# Patient Record
Sex: Male | Born: 1990 | Race: White | Hispanic: No | Marital: Married | Smoking: Never smoker
Health system: Southern US, Community
[De-identification: ages and names within clinical notes are randomized; demographics above are authoritative.]

## PROBLEM LIST (undated history)

## (undated) HISTORY — PX: ELBOW SURGERY: SHX618

## (undated) HISTORY — PX: HIP SURGERY: SHX245

---

## 2013-12-21 ENCOUNTER — Other Ambulatory Visit: Payer: Self-pay | Admitting: Surgery

## 2013-12-21 DIAGNOSIS — M5412 Radiculopathy, cervical region: Secondary | ICD-10-CM

## 2013-12-22 ENCOUNTER — Ambulatory Visit
Admission: RE | Admit: 2013-12-22 | Discharge: 2013-12-22 | Disposition: A | Discharge status: Home / Self Care | Payer: BC Managed Care – PPO | Source: Ambulatory Visit | Attending: Orthopedic Surgery | Admitting: Orthopedic Surgery

## 2013-12-22 DIAGNOSIS — M5412 Radiculopathy, cervical region: Secondary | ICD-10-CM

## 2016-06-30 ENCOUNTER — Ambulatory Visit (INDEPENDENT_AMBULATORY_CARE_PROVIDER_SITE_OTHER): Payer: BLUE CROSS/BLUE SHIELD | Admitting: Physician Assistant

## 2016-06-30 ENCOUNTER — Ambulatory Visit (INDEPENDENT_AMBULATORY_CARE_PROVIDER_SITE_OTHER): Payer: BLUE CROSS/BLUE SHIELD

## 2016-06-30 VITALS — BP 126/76 | HR 64 | Temp 97.8°F | Resp 20 | Ht 71.0 in | Wt 145.0 lb

## 2016-06-30 DIAGNOSIS — S0300XA Dislocation of jaw, unspecified side, initial encounter: Secondary | ICD-10-CM

## 2016-06-30 DIAGNOSIS — R6884 Jaw pain: Secondary | ICD-10-CM

## 2016-06-30 DIAGNOSIS — R252 Cramp and spasm: Secondary | ICD-10-CM | POA: Diagnosis not present

## 2016-06-30 DIAGNOSIS — Z23 Encounter for immunization: Secondary | ICD-10-CM

## 2016-06-30 LAB — POCT CBC
GRANULOCYTE PERCENT: 91.9 % — AB (ref 37–80)
HCT, POC: 47 % (ref 43.5–53.7)
Hemoglobin: 16.8 g/dL (ref 14.1–18.1)
LYMPH, POC: 1 (ref 0.6–3.4)
MCH, POC: 34 pg — AB (ref 27–31.2)
MCHC: 35.7 g/dL — AB (ref 31.8–35.4)
MCV: 95.2 fL (ref 80–97)
MID (cbc): 0.7 (ref 0–0.9)
MPV: 7.7 fL (ref 0–99.8)
POC Granulocyte: 18.7 — AB (ref 2–6.9)
POC LYMPH PERCENT: 4.9 %L — AB (ref 10–50)
POC MID %: 3.2 %M (ref 0–12)
Platelet Count, POC: 292 10*3/uL (ref 142–424)
RBC: 4.93 M/uL (ref 4.69–6.13)
RDW, POC: 12 %
WBC: 20.4 10*3/uL — AB (ref 4.6–10.2)

## 2016-06-30 LAB — POCT URINALYSIS DIP (MANUAL ENTRY)
Bilirubin, UA: NEGATIVE
Glucose, UA: NEGATIVE
Leukocytes, UA: NEGATIVE
Nitrite, UA: NEGATIVE
PH UA: 8.5
RBC UA: NEGATIVE
SPEC GRAV UA: 1.015
Urobilinogen, UA: 0.2

## 2016-06-30 LAB — CMP14+EGFR
ALT: 26 IU/L (ref 0–44)
AST: 23 IU/L (ref 0–40)
Albumin/Globulin Ratio: 2 (ref 1.2–2.2)
Albumin: 5.1 g/dL (ref 3.5–5.5)
Alkaline Phosphatase: 75 IU/L (ref 39–117)
BILIRUBIN TOTAL: 1 mg/dL (ref 0.0–1.2)
BUN/Creatinine Ratio: 18 (ref 9–20)
BUN: 17 mg/dL (ref 6–20)
CHLORIDE: 95 mmol/L — AB (ref 96–106)
CO2: 15 mmol/L — CL (ref 18–29)
Calcium: 10.1 mg/dL (ref 8.7–10.2)
Creatinine, Ser: 0.93 mg/dL (ref 0.76–1.27)
GFR calc Af Amer: 132 mL/min/{1.73_m2} (ref >59)
GFR calc non Af Amer: 114 mL/min/{1.73_m2} (ref >59)
GLOBULIN, TOTAL: 2.5 g/dL (ref 1.5–4.5)
Glucose: 125 mg/dL — ABNORMAL HIGH (ref 65–99)
Potassium: 4.2 mmol/L (ref 3.5–5.2)
SODIUM: 141 mmol/L (ref 134–144)
Total Protein: 7.6 g/dL (ref 6.0–8.5)

## 2016-06-30 MED ORDER — KETOROLAC TROMETHAMINE 30 MG/ML IJ SOLN
30.0000 mg | Freq: Once | INTRAMUSCULAR | Status: AC
  Administered 2016-06-30: 30 mg via INTRAMUSCULAR

## 2016-06-30 MED ORDER — DIPHENHYDRAMINE HCL 50 MG/ML IJ SOLN
50.0000 mg | Freq: Once | INTRAMUSCULAR | Status: AC
  Administered 2016-06-30: 50 mg via INTRAMUSCULAR

## 2016-06-30 MED ORDER — DIPHENHYDRAMINE HCL 50 MG/ML IJ SOLN
25.0000 mg | Freq: Once | INTRAMUSCULAR | Status: AC
  Administered 2016-06-30: 25 mg via INTRAMUSCULAR

## 2016-06-30 NOTE — Progress Notes (Signed)
06/30/2016 11:30 AM   DOB: [birth year] / MRN: [id number]  SUBJECTIVE:  Vincent Duran is a 26 y.o. male presenting for "my jaw has been locked up for the last 4 hours." 1 He has No Known Allergies.   He  has no past medical history on file.    He  reports that he has never smoked. He does not have any smokeless tobacco history on file. He  has no sexual activity history on file. The patient  has no past surgical history on file.  His family history is not on file.  Review of Systems  Constitutional: Negative for chills and fever.  Cardiovascular: Negative for chest pain.  Skin: Negative for itching and rash.  Neurological: Negative for dizziness.    The problem list and medications were reviewed and updated by myself where necessary and exist elsewhere in the encounter.   OBJECTIVE:  BP 126/76 (BP Location: Left Arm, Patient Position: Prone, Cuff Size: Normal)   Pulse 64   Temp 97.8 F (36.6 C) (Axillary)   Resp 20   Ht 5' 11"  (1.803 m)   Wt 145 lb (65.8 kg)   SpO2 100%   BMI 20.22 kg/m   Physical Exam  Constitutional: He is oriented to person, place, and time. He appears well-developed and well-nourished.  HENT:  Head:    Cardiovascular: Normal rate and regular rhythm.   Pulmonary/Chest: Effort normal and breath sounds normal.  Musculoskeletal: Normal range of motion.  Neurological: He is alert and oriented to person, place, and time.  Skin: Skin is warm and dry.    Results for orders placed or performed in visit on 06/30/16 (from the past 72 hour(s))  POCT CBC     Status: Abnormal   Collection Time: 06/30/16  8:56 AM  Result Value Ref Range   WBC 20.4 (A) 4.6 - 10.2 K/uL   Lymph, poc 1.0 0.6 - 3.4   POC LYMPH PERCENT 4.9 (A) 10 - 50 %L   MID (cbc) 0.7 0 - 0.9   POC MID % 3.2 0 - 12 %M   POC Granulocyte 18.7 (A) 2 - 6.9   Granulocyte percent 91.9 (A) 37 - 80 %G   RBC 4.93 4.69 - 6.13 M/uL   Hemoglobin 16.8 14.1 - 18.1 g/dL   HCT, POC 47.0 43.5 - 53.7 %   MCV  95.2 80 - 97 fL   MCH, POC 34.0 (A) 27 - 31.2 pg   MCHC 35.7 (A) 31.8 - 35.4 g/dL   RDW, POC 12.0 %   Platelet Count, POC 292 142 - 424 K/uL   MPV 7.7 0 - 99.8 fL  POCT urinalysis dipstick     Status: Abnormal   Collection Time: 06/30/16  9:37 AM  Result Value Ref Range   Color, UA yellow yellow   Clarity, UA clear clear   Glucose, UA negative negative   Bilirubin, UA negative negative   Ketones, POC UA >= (160) (A) negative   Spec Grav, UA 1.015    Blood, UA negative negative   pH, UA 8.5    Protein Ur, POC =30 (A) negative   Urobilinogen, UA 0.2    Nitrite, UA Negative Negative   Leukocytes, UA Negative Negative    Dg Mandible 4 Views  Result Date: 06/30/2016 CLINICAL DATA:  Mandibular cramping and locking for several hours. No known injury. The patient is unable to close is mouth. EXAM: MANDIBLE - 4+ VIEW COMPARISON:  None. FINDINGS: No fracture is identified. The mandibular  condyles demonstrate anterior translation over the condylar eminences as the patient's mouth is opened. Anterior translation is greater than typically seen with mouth opening on the left. Imaged bones and joints otherwise appear normal. IMPRESSION: Anterior translation of the mandibular condyles over the condylar eminence appears greater than typically seen with mouth opening on the left. Negative for fracture. Electronically Signed   By: Inge Rise M.D.   On: 06/30/2016 09:39    ASSESSMENT AND PLAN:  Brydon was seen today for body cramping and jaw locked.  Diagnoses and all orders for this visit:  Cramping of hands -     ketorolac (TORADOL) 30 MG/ML injection 30 mg; Inject 1 mL (30 mg total) into the muscle once. -     POCT CBC -     POCT urinalysis dipstick -     CMP14+EGFR -     diphenhydrAMINE (BENADRYL) injection 25 mg; Inject 0.5 mLs (25 mg total) into the muscle once.  Jaw pain -     diphenhydrAMINE (BENADRYL) injection 50 mg; Inject 1 mL (50 mg total) into the muscle once. -     DG  Mandible 4 Views; Future  Need for prophylactic vaccination with tetanus-diphtheria (TD) -     Td vaccine greater than or equal to 7yo preservative free IM  Dislocation of temporomandibular joint, initial encounter: Secondary to emesis.  He is well appearing and vitals have been stable throughout.  He has received 1.5 liters normal saline. Comments: Reduced in the clinic. Patient tolerated procedure without complaint.  Advised NSAIDS and soft foods for the next 48 hours.      The patient is advised to call or return to clinic if he does not see an improvement in symptoms, or to seek the care of the closest emergency department if he worsens with the above plan.   Philis Fendt, MHS, PA-C [facility] 06/30/2016 11:30 AM

## 2016-06-30 NOTE — Patient Instructions (Addendum)
Take 3 800 Motrin every 6-8hrs as needed.  IF you received an x-ray today, you will receive an invoice from [facility]. Please contact [facility] at [phone] with questions or concerns regarding your invoice.   IF you received labwork today, you will receive an invoice from [facility]. Please contact [facility] at [phone] with questions or concerns regarding your invoice.   Our billing staff will not be able to assist you with questions regarding bills from these companies.  You will be contacted with the lab results as soon as they are available. The fastest way to get your results is to activate your My Chart account. Instructions are located on the last page of this paperwork. If you have not heard from us regarding the results in 2 weeks, please contact this office.

## 2016-09-24 DIAGNOSIS — L249 Irritant contact dermatitis, unspecified cause: Secondary | ICD-10-CM | POA: Diagnosis not present

## 2016-11-05 DIAGNOSIS — M79672 Pain in left foot: Secondary | ICD-10-CM | POA: Diagnosis not present

## 2016-11-05 DIAGNOSIS — M722 Plantar fascial fibromatosis: Secondary | ICD-10-CM | POA: Diagnosis not present

## 2016-11-15 DIAGNOSIS — M722 Plantar fascial fibromatosis: Secondary | ICD-10-CM | POA: Diagnosis not present

## 2016-11-15 DIAGNOSIS — M79672 Pain in left foot: Secondary | ICD-10-CM | POA: Diagnosis not present

## 2017-04-01 ENCOUNTER — Ambulatory Visit (INDEPENDENT_AMBULATORY_CARE_PROVIDER_SITE_OTHER): Payer: BLUE CROSS/BLUE SHIELD | Admitting: Sports Medicine

## 2017-04-01 DIAGNOSIS — M216X1 Other acquired deformities of right foot: Secondary | ICD-10-CM

## 2017-04-01 DIAGNOSIS — M216X2 Other acquired deformities of left foot: Secondary | ICD-10-CM

## 2017-04-01 NOTE — Progress Notes (Signed)
   Subjective:    Patient ID: Vincent Duran, male    DOB: Jul 21, 1990, 26 y.o.   MRN: [id number]  HPI chief complaint: Left foot pain  Very pleasant 26 year old male comes in today complaining of 2 months of left foot pain. He denies any injury but rather describes a gradual onset of pain that he localizes primarily along the arch of his foot. He has been diagnosed with plantar fasciitis in the past. In fact, he has had 2 previous bouts of plantar fasciitis. This was treated with physical therapy as well as night splinting. He was also treated with a brief period of immobilization in a boot. When his pain returned several weeks ago he resumed wearing his Cam Walker and as a result his symptoms are improving. He has tried changing running shoes but unfortunately has not been able to return to running due to his pain. 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 No numbness or tingling. No recent imaging.  Past medical history reviewed Medications reviewed Allergies reviewed     Review of Systems    As above  Objective:   Physical Exam  Well-developed, fit appearing. No acute distress. Awake alert and oriented 3. Vital signs reviewed  Examination of the left foot shows a rigid cavus foot. There is no tenderness to palpation at the calcaneal origin of the plantar fascia. No significant tenderness along the arch of the foot. No soft tissue swelling. Negative calcaneal squeeze. Neurovascularly intact distally. Evaluation of his running gait shows him to be a midfoot Striker. Runs without a limp.  MSK ultrasound of the left heel was performed. Limited images were obtained. His plantar fascia is within normal limits. There is a small hypoechoic area which I believe is some slight edema but I do not see a tear. Calcaneus is well visualized without any obvious bone spur.      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 I think he likely has a chronic arch strain. He definitely needs arch support. He has an off-the-shelf arch support which is uncomfortable. We will try green sports insoles with a scaphoid pad and he will return to the office in 4 weeks for reevaluation. I think he would benefit greatly from a custom orthotic at some point in time and if he finds the scaphoid pads to be comfortable then we will consider constructing those at his follow-up visit.

## 2017-04-15 ENCOUNTER — Encounter: Payer: BLUE CROSS/BLUE SHIELD | Admitting: Sports Medicine

## 2017-04-29 DIAGNOSIS — M542 Cervicalgia: Secondary | ICD-10-CM | POA: Diagnosis not present

## 2017-04-29 DIAGNOSIS — M9902 Segmental and somatic dysfunction of thoracic region: Secondary | ICD-10-CM | POA: Diagnosis not present

## 2017-04-29 DIAGNOSIS — M9901 Segmental and somatic dysfunction of cervical region: Secondary | ICD-10-CM | POA: Diagnosis not present

## 2017-04-29 DIAGNOSIS — M546 Pain in thoracic spine: Secondary | ICD-10-CM | POA: Diagnosis not present

## 2017-04-30 DIAGNOSIS — M9901 Segmental and somatic dysfunction of cervical region: Secondary | ICD-10-CM | POA: Diagnosis not present

## 2017-04-30 DIAGNOSIS — M546 Pain in thoracic spine: Secondary | ICD-10-CM | POA: Diagnosis not present

## 2017-04-30 DIAGNOSIS — M542 Cervicalgia: Secondary | ICD-10-CM | POA: Diagnosis not present

## 2017-04-30 DIAGNOSIS — M9902 Segmental and somatic dysfunction of thoracic region: Secondary | ICD-10-CM | POA: Diagnosis not present

## 2017-05-03 DIAGNOSIS — M9902 Segmental and somatic dysfunction of thoracic region: Secondary | ICD-10-CM | POA: Diagnosis not present

## 2017-05-03 DIAGNOSIS — M542 Cervicalgia: Secondary | ICD-10-CM | POA: Diagnosis not present

## 2017-05-03 DIAGNOSIS — M546 Pain in thoracic spine: Secondary | ICD-10-CM | POA: Diagnosis not present

## 2017-05-03 DIAGNOSIS — M9901 Segmental and somatic dysfunction of cervical region: Secondary | ICD-10-CM | POA: Diagnosis not present

## 2017-05-06 DIAGNOSIS — M9901 Segmental and somatic dysfunction of cervical region: Secondary | ICD-10-CM | POA: Diagnosis not present

## 2017-05-06 DIAGNOSIS — M542 Cervicalgia: Secondary | ICD-10-CM | POA: Diagnosis not present

## 2017-05-06 DIAGNOSIS — M546 Pain in thoracic spine: Secondary | ICD-10-CM | POA: Diagnosis not present

## 2017-05-06 DIAGNOSIS — M9902 Segmental and somatic dysfunction of thoracic region: Secondary | ICD-10-CM | POA: Diagnosis not present

## 2017-05-07 ENCOUNTER — Ambulatory Visit: Payer: BLUE CROSS/BLUE SHIELD | Admitting: Sports Medicine

## 2017-05-10 DIAGNOSIS — M9902 Segmental and somatic dysfunction of thoracic region: Secondary | ICD-10-CM | POA: Diagnosis not present

## 2017-05-10 DIAGNOSIS — M542 Cervicalgia: Secondary | ICD-10-CM | POA: Diagnosis not present

## 2017-05-10 DIAGNOSIS — M9901 Segmental and somatic dysfunction of cervical region: Secondary | ICD-10-CM | POA: Diagnosis not present

## 2017-05-10 DIAGNOSIS — M546 Pain in thoracic spine: Secondary | ICD-10-CM | POA: Diagnosis not present

## 2017-05-14 DIAGNOSIS — M9901 Segmental and somatic dysfunction of cervical region: Secondary | ICD-10-CM | POA: Diagnosis not present

## 2017-05-14 DIAGNOSIS — M546 Pain in thoracic spine: Secondary | ICD-10-CM | POA: Diagnosis not present

## 2017-05-14 DIAGNOSIS — M9902 Segmental and somatic dysfunction of thoracic region: Secondary | ICD-10-CM | POA: Diagnosis not present

## 2017-05-14 DIAGNOSIS — M542 Cervicalgia: Secondary | ICD-10-CM | POA: Diagnosis not present

## 2017-05-20 DIAGNOSIS — M9902 Segmental and somatic dysfunction of thoracic region: Secondary | ICD-10-CM | POA: Diagnosis not present

## 2017-05-20 DIAGNOSIS — M546 Pain in thoracic spine: Secondary | ICD-10-CM | POA: Diagnosis not present

## 2017-05-20 DIAGNOSIS — M9901 Segmental and somatic dysfunction of cervical region: Secondary | ICD-10-CM | POA: Diagnosis not present

## 2017-05-20 DIAGNOSIS — M542 Cervicalgia: Secondary | ICD-10-CM | POA: Diagnosis not present

## 2017-05-24 DIAGNOSIS — M542 Cervicalgia: Secondary | ICD-10-CM | POA: Diagnosis not present

## 2017-05-24 DIAGNOSIS — M546 Pain in thoracic spine: Secondary | ICD-10-CM | POA: Diagnosis not present

## 2017-05-24 DIAGNOSIS — M9901 Segmental and somatic dysfunction of cervical region: Secondary | ICD-10-CM | POA: Diagnosis not present

## 2017-05-24 DIAGNOSIS — M9902 Segmental and somatic dysfunction of thoracic region: Secondary | ICD-10-CM | POA: Diagnosis not present

## 2017-05-29 DIAGNOSIS — M546 Pain in thoracic spine: Secondary | ICD-10-CM | POA: Diagnosis not present

## 2017-05-29 DIAGNOSIS — M542 Cervicalgia: Secondary | ICD-10-CM | POA: Diagnosis not present

## 2017-05-29 DIAGNOSIS — M9902 Segmental and somatic dysfunction of thoracic region: Secondary | ICD-10-CM | POA: Diagnosis not present

## 2017-05-29 DIAGNOSIS — M9901 Segmental and somatic dysfunction of cervical region: Secondary | ICD-10-CM | POA: Diagnosis not present

## 2017-06-05 DIAGNOSIS — M9901 Segmental and somatic dysfunction of cervical region: Secondary | ICD-10-CM | POA: Diagnosis not present

## 2017-06-05 DIAGNOSIS — M546 Pain in thoracic spine: Secondary | ICD-10-CM | POA: Diagnosis not present

## 2017-06-05 DIAGNOSIS — M542 Cervicalgia: Secondary | ICD-10-CM | POA: Diagnosis not present

## 2017-06-05 DIAGNOSIS — M9902 Segmental and somatic dysfunction of thoracic region: Secondary | ICD-10-CM | POA: Diagnosis not present

## 2017-06-10 DIAGNOSIS — M546 Pain in thoracic spine: Secondary | ICD-10-CM | POA: Diagnosis not present

## 2017-06-10 DIAGNOSIS — M9902 Segmental and somatic dysfunction of thoracic region: Secondary | ICD-10-CM | POA: Diagnosis not present

## 2017-06-10 DIAGNOSIS — M542 Cervicalgia: Secondary | ICD-10-CM | POA: Diagnosis not present

## 2017-06-10 DIAGNOSIS — M9901 Segmental and somatic dysfunction of cervical region: Secondary | ICD-10-CM | POA: Diagnosis not present

## 2017-06-20 DIAGNOSIS — M722 Plantar fascial fibromatosis: Secondary | ICD-10-CM | POA: Diagnosis not present

## 2017-06-26 DIAGNOSIS — M542 Cervicalgia: Secondary | ICD-10-CM | POA: Diagnosis not present

## 2017-06-26 DIAGNOSIS — M9902 Segmental and somatic dysfunction of thoracic region: Secondary | ICD-10-CM | POA: Diagnosis not present

## 2017-06-26 DIAGNOSIS — M546 Pain in thoracic spine: Secondary | ICD-10-CM | POA: Diagnosis not present

## 2017-06-26 DIAGNOSIS — M9901 Segmental and somatic dysfunction of cervical region: Secondary | ICD-10-CM | POA: Diagnosis not present

## 2017-07-04 DIAGNOSIS — M9901 Segmental and somatic dysfunction of cervical region: Secondary | ICD-10-CM | POA: Diagnosis not present

## 2017-07-04 DIAGNOSIS — M542 Cervicalgia: Secondary | ICD-10-CM | POA: Diagnosis not present

## 2017-07-04 DIAGNOSIS — M546 Pain in thoracic spine: Secondary | ICD-10-CM | POA: Diagnosis not present

## 2017-07-04 DIAGNOSIS — M9902 Segmental and somatic dysfunction of thoracic region: Secondary | ICD-10-CM | POA: Diagnosis not present

## 2017-07-12 DIAGNOSIS — M722 Plantar fascial fibromatosis: Secondary | ICD-10-CM | POA: Diagnosis not present

## 2017-07-24 DIAGNOSIS — M546 Pain in thoracic spine: Secondary | ICD-10-CM | POA: Diagnosis not present

## 2017-07-24 DIAGNOSIS — M9902 Segmental and somatic dysfunction of thoracic region: Secondary | ICD-10-CM | POA: Diagnosis not present

## 2017-07-24 DIAGNOSIS — M9901 Segmental and somatic dysfunction of cervical region: Secondary | ICD-10-CM | POA: Diagnosis not present

## 2017-07-24 DIAGNOSIS — M542 Cervicalgia: Secondary | ICD-10-CM | POA: Diagnosis not present

## 2017-08-05 DIAGNOSIS — M9901 Segmental and somatic dysfunction of cervical region: Secondary | ICD-10-CM | POA: Diagnosis not present

## 2017-08-05 DIAGNOSIS — M542 Cervicalgia: Secondary | ICD-10-CM | POA: Diagnosis not present

## 2017-08-05 DIAGNOSIS — M9902 Segmental and somatic dysfunction of thoracic region: Secondary | ICD-10-CM | POA: Diagnosis not present

## 2017-08-05 DIAGNOSIS — M546 Pain in thoracic spine: Secondary | ICD-10-CM | POA: Diagnosis not present

## 2017-08-22 DIAGNOSIS — M542 Cervicalgia: Secondary | ICD-10-CM | POA: Diagnosis not present

## 2017-08-22 DIAGNOSIS — M9902 Segmental and somatic dysfunction of thoracic region: Secondary | ICD-10-CM | POA: Diagnosis not present

## 2017-08-22 DIAGNOSIS — M546 Pain in thoracic spine: Secondary | ICD-10-CM | POA: Diagnosis not present

## 2017-08-22 DIAGNOSIS — M9901 Segmental and somatic dysfunction of cervical region: Secondary | ICD-10-CM | POA: Diagnosis not present

## 2017-09-17 DIAGNOSIS — Z7689 Persons encountering health services in other specified circumstances: Secondary | ICD-10-CM | POA: Diagnosis not present

## 2017-09-17 DIAGNOSIS — R5383 Other fatigue: Secondary | ICD-10-CM | POA: Diagnosis not present

## 2017-09-18 DIAGNOSIS — M542 Cervicalgia: Secondary | ICD-10-CM | POA: Diagnosis not present

## 2017-09-18 DIAGNOSIS — M9902 Segmental and somatic dysfunction of thoracic region: Secondary | ICD-10-CM | POA: Diagnosis not present

## 2017-09-18 DIAGNOSIS — M546 Pain in thoracic spine: Secondary | ICD-10-CM | POA: Diagnosis not present

## 2017-09-18 DIAGNOSIS — M9901 Segmental and somatic dysfunction of cervical region: Secondary | ICD-10-CM | POA: Diagnosis not present

## 2017-10-01 DIAGNOSIS — M722 Plantar fascial fibromatosis: Secondary | ICD-10-CM | POA: Diagnosis not present

## 2017-10-01 DIAGNOSIS — M79672 Pain in left foot: Secondary | ICD-10-CM | POA: Diagnosis not present

## 2017-10-07 DIAGNOSIS — M722 Plantar fascial fibromatosis: Secondary | ICD-10-CM | POA: Diagnosis not present

## 2017-10-07 DIAGNOSIS — M79672 Pain in left foot: Secondary | ICD-10-CM | POA: Diagnosis not present

## 2017-10-11 DIAGNOSIS — M79672 Pain in left foot: Secondary | ICD-10-CM | POA: Diagnosis not present

## 2017-10-11 DIAGNOSIS — M722 Plantar fascial fibromatosis: Secondary | ICD-10-CM | POA: Diagnosis not present

## 2017-10-14 DIAGNOSIS — M79672 Pain in left foot: Secondary | ICD-10-CM | POA: Diagnosis not present

## 2017-10-14 DIAGNOSIS — M722 Plantar fascial fibromatosis: Secondary | ICD-10-CM | POA: Diagnosis not present

## 2017-10-21 DIAGNOSIS — M722 Plantar fascial fibromatosis: Secondary | ICD-10-CM | POA: Diagnosis not present

## 2017-10-21 DIAGNOSIS — M79672 Pain in left foot: Secondary | ICD-10-CM | POA: Diagnosis not present

## 2017-10-25 DIAGNOSIS — M79672 Pain in left foot: Secondary | ICD-10-CM | POA: Diagnosis not present

## 2017-10-25 DIAGNOSIS — M722 Plantar fascial fibromatosis: Secondary | ICD-10-CM | POA: Diagnosis not present

## 2017-10-30 DIAGNOSIS — M79672 Pain in left foot: Secondary | ICD-10-CM | POA: Diagnosis not present

## 2017-10-30 DIAGNOSIS — M722 Plantar fascial fibromatosis: Secondary | ICD-10-CM | POA: Diagnosis not present

## 2017-11-05 DIAGNOSIS — M79672 Pain in left foot: Secondary | ICD-10-CM | POA: Diagnosis not present

## 2017-11-05 DIAGNOSIS — M722 Plantar fascial fibromatosis: Secondary | ICD-10-CM | POA: Diagnosis not present

## 2017-11-12 ENCOUNTER — Encounter: Payer: Self-pay | Admitting: Family Medicine

## 2017-11-12 ENCOUNTER — Ambulatory Visit: Payer: BLUE CROSS/BLUE SHIELD | Admitting: Family Medicine

## 2017-11-12 DIAGNOSIS — M722 Plantar fascial fibromatosis: Secondary | ICD-10-CM

## 2017-11-12 MED ORDER — NITROGLYCERIN 0.2 MG/HR TD PT24: MEDICATED_PATCH | TRANSDERMAL | 1 refills | Status: DC

## 2017-11-12 NOTE — Patient Instructions (Signed)
You have plantar fasciitis Take tylenol and/or aleve as needed for pain  Nitro patches 1/4th patch to affected arch, change daily. Plantar fascia stretch for 20-30 seconds (do 3 of these) in morning Lowering/raise on a step exercises 3 x 10 once or twice a day - this is very important for long term recovery. Can add heel walks, toe walks forward and backward as well Ice heel for 15 minutes as needed. Avoid flat shoes/barefoot walking as much as possible. Arch straps have been shown to help with pain. Consider custom orthotics - make appointment with [place] for 45 minutes if you want to do these. Steroid injection is a consideration for short term pain relief if you are struggling. Physical therapy is also an option. Follow up with me in 6 weeks.

## 2017-11-14 ENCOUNTER — Encounter: Payer: Self-pay | Admitting: Family Medicine

## 2017-11-14 DIAGNOSIS — M79672 Pain in left foot: Secondary | ICD-10-CM | POA: Diagnosis not present

## 2017-11-14 DIAGNOSIS — M722 Plantar fascial fibromatosis: Secondary | ICD-10-CM | POA: Diagnosis not present

## 2017-11-14 NOTE — Assessment & Plan Note (Signed)
discussed/reviewed home exercises and stretches.  Arch binders, consider our custom orthotics though he's tried a different pair without much benefit.  Icing, tylenol or aleve if needed.  Will trial nitro patches for this as well.  F/u in 6 weeks.

## 2017-11-14 NOTE — Progress Notes (Signed)
PCP: Patient, No Pcp Per  Subjective:   HPI: Patient is a 27 y.o. male here for left foot pain.  Patient reports he has not had an injury to his left foot. Reports 3 times over past 3 years he's had flare of plantar left foot pain. Pain is within his arch though most recent pain started about 10 months ago, is 5/10 in severity, can be sharp. Has not run because of this. Tried crutch for a month. Also tried physical therapy, dry needling, trigger point treatments, chiropractic care, custom orthotics, home exercises and stretches. No skin changes, numbness.  History reviewed. No pertinent past medical history.  No current outpatient medications on file prior to visit.   No current facility-administered medications on file prior to visit.     History reviewed. No pertinent surgical history.  Allergies  Allergen Reactions  . Other Anaphylaxis    Pine nuts  . Peanut-Containing Drug Products Anaphylaxis    Social History   Socioeconomic History  . Marital status: Single    Spouse name: Not on file  . Number of children: Not on file  . Years of education: Not on file  . Highest education level: Not on file  Occupational History  . Not on file  Social Needs  . Financial resource strain: Not on file  . Food insecurity:    Worry: Not on file    Inability: Not on file  . Transportation needs:    Medical: Not on file    Non-medical: Not on file  Tobacco Use  . Smoking status: Never Smoker  . Smokeless tobacco: Never Used  Substance and Sexual Activity  . Alcohol use: Not on file  . Drug use: Not on file  . Sexual activity: Not on file  Lifestyle  . Physical activity:    Days per week: Not on file    Minutes per session: Not on file  . Stress: Not on file  Relationships  . Social connections:    Talks on phone: Not on file    Gets together: Not on file    Attends religious service: Not on file    Active member of club or organization: Not on file    Attends  meetings of clubs or organizations: Not on file    Relationship status: Not on file  . Intimate partner violence:    Fear of current or ex partner: Not on file    Emotionally abused: Not on file    Physically abused: Not on file    Forced sexual activity: Not on file  Other Topics Concern  . Not on file  Social History Narrative  . Not on file    History reviewed. No pertinent family history.  BP 109/84   Pulse 79   Ht  (1.803 m)   Wt 145 lb (65.8 kg)   BMI 20.22 kg/m   Review of Systems: See HPI above.     Objective:  Physical Exam:  Gen: NAD, comfortable in exam room  Left foot/ankle: Cavus arch.  No other deformity, swelling, bruising. FROM with 5/5 strength. TTP medial calcaneus at plantar fascia insertion and within plantar fascia body.  No other tenderness. Negative ant drawer and talar tilt.   Negative syndesmotic compression. Negative calcaneal squeeze. Thompsons test negative. NV intact distally.  Right foot/ankle: Cavus.  No deformity. FROM with 5/5 strength. No tenderness to palpation. NVI distally.   Assessment & Plan:  1. Left plantar fasciitis - discussed/reviewed home exercises and stretches.  Arch binders, consider our custom orthotics though he's tried a different pair without much benefit.  Icing, tylenol or aleve if needed.  Will trial nitro patches for this as well.  F/u in 6 weeks.

## 2017-11-19 ENCOUNTER — Encounter: Payer: Self-pay | Admitting: Family Medicine

## 2017-11-19 ENCOUNTER — Ambulatory Visit: Payer: BLUE CROSS/BLUE SHIELD | Admitting: Family Medicine

## 2017-11-19 DIAGNOSIS — M722 Plantar fascial fibromatosis: Secondary | ICD-10-CM | POA: Diagnosis not present

## 2017-11-20 ENCOUNTER — Encounter: Payer: Self-pay | Admitting: Family Medicine

## 2017-11-20 NOTE — Progress Notes (Signed)
PCP: Patient, No Pcp Per  Subjective:   HPI: Patient is a 27 y.o. male here for custom orthotics.  5/21: Patient reports he has not had an injury to his left foot. Reports 3 times over past 3 years he's had flare of plantar left foot pain. Pain is within his arch though most recent pain started about 10 months ago, is 5/10 in severity, can be sharp. Has not run because of this. Tried crutch for a month. Also tried physical therapy, dry needling, trigger point treatments, chiropractic care, custom orthotics, home exercises and stretches. No skin changes, numbness.  5/28: Patient returns for custom orthotics today.  History reviewed. No pertinent past medical history.  Current Outpatient Medications on File Prior to Visit  Medication Sig Dispense Refill  . nitroGLYCERIN (NITRODUR - DOSED IN MG/24 HR) 0.2 mg/hr patch Apply 1/4th patch to affected arch, change daily 30 patch 1   No current facility-administered medications on file prior to visit.     History reviewed. No pertinent surgical history.  Allergies  Allergen Reactions  . Other Anaphylaxis    Pine nuts  . Peanut-Containing Drug Products Anaphylaxis    Social History   Socioeconomic History  . Marital status: Single    Spouse name: Not on file  . Number of children: Not on file  . Years of education: Not on file  . Highest education level: Not on file  Occupational History  . Not on file  Social Needs  . Financial resource strain: Not on file  . Food insecurity:    Worry: Not on file    Inability: Not on file  . Transportation needs:    Medical: Not on file    Non-medical: Not on file  Tobacco Use  . Smoking status: Never Smoker  . Smokeless tobacco: Never Used  Substance and Sexual Activity  . Alcohol use: Not on file  . Drug use: Not on file  . Sexual activity: Not on file  Lifestyle  . Physical activity:    Days per week: Not on file    Minutes per session: Not on file  . Stress: Not on file   Relationships  . Social connections:    Talks on phone: Not on file    Gets together: Not on file    Attends religious service: Not on file    Active member of club or organization: Not on file    Attends meetings of clubs or organizations: Not on file    Relationship status: Not on file  . Intimate partner violence:    Fear of current or ex partner: Not on file    Emotionally abused: Not on file    Physically abused: Not on file    Forced sexual activity: Not on file  Other Topics Concern  . Not on file  Social History Narrative  . Not on file    History reviewed. No pertinent family history.  BP 124/74   Ht  (1.803 m)   Wt 145 lb (65.8 kg)   BMI 20.22 kg/m   Review of Systems: See HPI above.     Objective:  Physical Exam:  Gen: NAD, comfortable in exam room  Left foot/ankle: Cavus.  Dropped 4th MT head with minimal callus over this.  No hallux rigidus or valgus. FROM ankle, digits with 5/5 strength. TTP medial calcaneus at PF insertion and within plantar fascia body. NVI distally. Leg lengths equal.  Right foot/ankle: Cavus.  Dropped 4th MT head with minimal  callus over this.  No hallux rigidus or valgus. FROM ankle, digits with 5/5 strength. No TTP. NVI distally.   Assessment & Plan:  1. Left plantar fasciitis - Continue arch binder, home exercises and stretches.  Custom orthotics made today and felt comfortable.  Icing, tylenol, aleve if needed.  Continue nitro patches.  F/u in 5 weeks.  Patient was fitted for a : standard, cushioned, semi-rigid orthotic. The orthotic was heated and afterward the patient stood on the orthotic blank positioned on the orthotic stand. The patient was positioned in subtalar neutral position and 10 degrees of ankle dorsiflexion in a weight bearing stance. After completion of molding, a stable base was applied to the orthotic blank. The blank was ground to a stable position for weight bearing. Size: 11 blue swirl Base:  blue med density eva Posting: none Additional orthotic padding: none Discussed metatarsal pad addition if he develops forefoot pain. Total prep time 35 minutes - > 50% of which spent on counseling, answering questions, reviewing how to use orthotics, what to call us for.

## 2017-11-20 NOTE — Assessment & Plan Note (Signed)
Continue arch binder, home exercises and stretches.  Custom orthotics made today and felt comfortable.  Icing, tylenol, aleve if needed.  Continue nitro patches.  F/u in 5 weeks.  Patient was fitted for a : standard, cushioned, semi-rigid orthotic. The orthotic was heated and afterward the patient stood on the orthotic blank positioned on the orthotic stand. The patient was positioned in subtalar neutral position and 10 degrees of ankle dorsiflexion in a weight bearing stance. After completion of molding, a stable base was applied to the orthotic blank. The blank was ground to a stable position for weight bearing. Size: 11 blue swirl Base: blue med density eva Posting: none Additional orthotic padding: none Discussed metatarsal pad addition if he develops forefoot pain. Total prep time 35 minutes - > 50% of which spent on counseling, answering questions, reviewing how to use orthotics, what to call us for.

## 2017-11-21 DIAGNOSIS — M722 Plantar fascial fibromatosis: Secondary | ICD-10-CM | POA: Diagnosis not present

## 2017-11-21 DIAGNOSIS — M79672 Pain in left foot: Secondary | ICD-10-CM | POA: Diagnosis not present

## 2017-11-25 ENCOUNTER — Ambulatory Visit: Payer: BLUE CROSS/BLUE SHIELD | Admitting: Family Medicine

## 2017-11-26 DIAGNOSIS — M722 Plantar fascial fibromatosis: Secondary | ICD-10-CM | POA: Diagnosis not present

## 2017-11-26 DIAGNOSIS — M79672 Pain in left foot: Secondary | ICD-10-CM | POA: Diagnosis not present

## 2017-12-24 DIAGNOSIS — M722 Plantar fascial fibromatosis: Secondary | ICD-10-CM | POA: Diagnosis not present

## 2017-12-24 DIAGNOSIS — M79672 Pain in left foot: Secondary | ICD-10-CM | POA: Diagnosis not present

## 2017-12-25 ENCOUNTER — Ambulatory Visit: Payer: BLUE CROSS/BLUE SHIELD | Admitting: Family Medicine

## 2017-12-25 ENCOUNTER — Encounter: Payer: Self-pay | Admitting: Family Medicine

## 2017-12-25 DIAGNOSIS — M722 Plantar fascial fibromatosis: Secondary | ICD-10-CM | POA: Diagnosis not present

## 2017-12-25 NOTE — Patient Instructions (Signed)
Continue with the arch binder that you're using. Continue the home exercises 3 sets of 10-15 4-5 days a week. Try 1/2 nitro patch and change daily - We're probably looking at another 3-4 months of using this but it depends on how you're responding to it and when your pain has resolved. Let me know how you're doing in about 6 weeks. An injection is an option as well.

## 2017-12-27 ENCOUNTER — Encounter: Payer: Self-pay | Admitting: Family Medicine

## 2017-12-27 NOTE — Assessment & Plan Note (Signed)
He will try 1/2 nitro patches, continue with home exercises and stretches along with his arch binders.  Can consider injection.  Let us know how he's doing in about 6 weeks.

## 2017-12-27 NOTE — Progress Notes (Signed)
PCP: Patient, No Pcp Per  Subjective:   HPI: Patient is a 27 y.o. male here for left plantar fasciitis.  5/21: Patient reports he has not had an injury to his left foot. Reports 3 times over past 3 years he's had flare of plantar left foot pain. Pain is within his arch though most recent pain started about 10 months ago, is 5/10 in severity, can be sharp. Has not run because of this. Tried crutch for a month. Also tried physical therapy, dry needling, trigger point treatments, chiropractic care, custom orthotics, home exercises and stretches. No skin changes, numbness.  5/28: Patient returns for custom orthotics today.  7/3: Patient states he could not tolerated the orthotics despite trying to break them in. He is a little better though with home exercises, stretches, using a heel strap/arch binder, nitro patches. Pain level at 2-4/10. Worse after he tried a 2 mile run. No numbness.  History reviewed. No pertinent past medical history.  Current Outpatient Medications on File Prior to Visit  Medication Sig Dispense Refill  . nitroGLYCERIN (NITRODUR - DOSED IN MG/24 HR) 0.2 mg/hr patch Apply 1/4th patch to affected arch, change daily 30 patch 1   No current facility-administered medications on file prior to visit.     History reviewed. No pertinent surgical history.  Allergies  Allergen Reactions  . Other Anaphylaxis    Pine nuts  . Peanut-Containing Drug Products Anaphylaxis    Social History   Socioeconomic History  . Marital status: Single    Spouse name: Not on file  . Number of children: Not on file  . Years of education: Not on file  . Highest education level: Not on file  Occupational History  . Not on file  Social Needs  . Financial resource strain: Not on file  . Food insecurity:    Worry: Not on file    Inability: Not on file  . Transportation needs:    Medical: Not on file    Non-medical: Not on file  Tobacco Use  . Smoking status: Never Smoker   . Smokeless tobacco: Never Used  Substance and Sexual Activity  . Alcohol use: Not on file  . Drug use: Not on file  . Sexual activity: Not on file  Lifestyle  . Physical activity:    Days per week: Not on file    Minutes per session: Not on file  . Stress: Not on file  Relationships  . Social connections:    Talks on phone: Not on file    Gets together: Not on file    Attends religious service: Not on file    Active member of club or organization: Not on file    Attends meetings of clubs or organizations: Not on file    Relationship status: Not on file  . Intimate partner violence:    Fear of current or ex partner: Not on file    Emotionally abused: Not on file    Physically abused: Not on file    Forced sexual activity: Not on file  Other Topics Concern  . Not on file  Social History Narrative  . Not on file    History reviewed. No pertinent family history.  BP 107/67   Pulse 68   Ht 5\' 11"  (1.803 m)   Wt 145 lb (65.8 kg)   BMI 20.22 kg/m   Review of Systems: See HPI above.     Objective:  Physical Exam:  Gen: NAD, comfortable in exam room  Left foot/ankle: Cavus.  Dropped 4th MT head.  No hallux rigidus or valgus. FROM ankle and digits with 5/5 strength. TTP in plantar fascia and at insertion on medial calcaneus.  No other tenderness currently. NVI distally.   Assessment & Plan:  1. Left plantar fasciitis - He will try 1/2 nitro patches, continue with home exercises and stretches along with his arch binders.  Can consider injection.  Let us know how he's doing in about 6 weeks.

## 2018-03-20 ENCOUNTER — Ambulatory Visit: Payer: BLUE CROSS/BLUE SHIELD | Admitting: Family Medicine

## 2018-03-20 ENCOUNTER — Encounter: Payer: Self-pay | Admitting: Family Medicine

## 2018-03-20 VITALS — BP 115/68 | HR 64 | Ht 71.0 in | Wt 145.0 lb

## 2018-03-20 DIAGNOSIS — M25572 Pain in left ankle and joints of left foot: Secondary | ICD-10-CM

## 2018-03-20 DIAGNOSIS — G8929 Other chronic pain: Secondary | ICD-10-CM

## 2018-03-20 NOTE — Patient Instructions (Addendum)
We will go ahead with an MRI of your ankle which will include the proximal foot and the heel.  I will contact you with the results and next steps based on this.

## 2018-03-24 ENCOUNTER — Encounter: Payer: Self-pay | Admitting: Family Medicine

## 2018-03-24 NOTE — Progress Notes (Signed)
PCP: Patient, No Pcp Per  Subjective:   HPI: Patient is a 27 y.o. male here for left plantar fasciitis.  5/21: Patient reports he has not had an injury to his left foot. Reports 3 times over past 3 years he's had flare of plantar left foot pain. Pain is within his arch though most recent pain started about 10 months ago, is 5/10 in severity, can be sharp. Has not run because of this. Tried crutch for a month. Also tried physical therapy, dry needling, trigger point treatments, chiropractic care, custom orthotics, home exercises and stretches. No skin changes, numbness.  5/28: Patient returns for custom orthotics today.  7/3: Patient states he could not tolerated the orthotics despite trying to break them in. He is a little better though with home exercises, stretches, using a heel strap/arch binder, nitro patches. Pain level at 2-4/10. Worse after he tried a 2 mile run. No numbness.  9/26: Unfortunately [place] continues to struggle with left foot pain. Had some improvement for about 2 months but not completely. Able to run up to 1.5 miles without increased pain but had pain on running up to 2.5 miles that worsened. Tried playing basketball and no pain first time - second time pain worsened medial, lateral ankle and plantar to lateral foot. Having to walk with a crutch now. No acute injury with the above. Pain level is 6/10 and sharp, worse with walking. Tried epsom salt soaks, heat, ice, home exercises and 1/2 nitro patches. No skin changes, numbness.  History reviewed. No pertinent past medical history.  Current Outpatient Medications on File Prior to Visit  Medication Sig Dispense Refill  . nitroGLYCERIN (NITRODUR - DOSED IN MG/24 HR) 0.2 mg/hr patch Apply 1/4th patch to affected arch, change daily 30 patch 1   No current facility-administered medications on file prior to visit.     History reviewed. No pertinent surgical history.  Allergies  Allergen Reactions  .  Other Anaphylaxis    Pine nuts  . Peanut-Containing Drug Products Anaphylaxis    Social History   Socioeconomic History  . Marital status: Single    Spouse name: Not on file  . Number of children: Not on file  . Years of education: Not on file  . Highest education level: Not on file  Occupational History  . Not on file  Social Needs  . Financial resource strain: Not on file  . Food insecurity:    Worry: Not on file    Inability: Not on file  . Transportation needs:    Medical: Not on file    Non-medical: Not on file  Tobacco Use  . Smoking status: Never Smoker  . Smokeless tobacco: Never Used  Substance and Sexual Activity  . Alcohol use: Not on file  . Drug use: Not on file  . Sexual activity: Not on file  Lifestyle  . Physical activity:    Days per week: Not on file    Minutes per session: Not on file  . Stress: Not on file  Relationships  . Social connections:    Talks on phone: Not on file    Gets together: Not on file    Attends religious service: Not on file    Active member of club or organization: Not on file    Attends meetings of clubs or organizations: Not on file    Relationship status: Not on file  . Intimate partner violence:    Fear of current or ex partner: Not on file  Emotionally abused: Not on file    Physically abused: Not on file    Forced sexual activity: Not on file  Other Topics Concern  . Not on file  Social History Narrative  . Not on file    History reviewed. No pertinent family history.  BP 115/68   Pulse 64   Ht 5\' 11"  (1.803 m)   Wt 145 lb (65.8 kg)   BMI 20.22 kg/m   Review of Systems: See HPI above.     Objective:  Physical Exam:  Gen: NAD, comfortable in exam room  Left foot/ankle: Cavus.  Dropped 4th MT head.  No other deformity, swelling, bruising. FROM ankle and digits with 5/5 strength all directions. Tenderness to palpation in middle of plantar fascia, inferior aspect of navicular, some of cuboid up to  medial and lateral ankles.  No other tenderness. NVI distally.   Assessment & Plan:  1. Left ankle/foot pain - unfortunately he continues to struggle with left ankle/foot pain that's been ongoing despite extensive conservative measures including nitro patches, home exercises, stretches, physical therapy, custom orthotics, arch binders.  Recommended at this point we go ahead with MRI of the ankle (windows include the proximal foot and heel) to further assess - difficult to localize pain to one specific location given current exam.  Continue with nitro, HEP, arch binder in meantime.

## 2018-03-26 ENCOUNTER — Ambulatory Visit
Admission: RE | Admit: 2018-03-26 | Discharge: 2018-03-26 | Disposition: A | Discharge status: Home / Self Care | Payer: BLUE CROSS/BLUE SHIELD | Source: Ambulatory Visit | Attending: Family Medicine | Admitting: Family Medicine

## 2018-03-26 DIAGNOSIS — G8929 Other chronic pain: Secondary | ICD-10-CM

## 2018-03-26 DIAGNOSIS — R600 Localized edema: Secondary | ICD-10-CM | POA: Diagnosis not present

## 2018-03-26 DIAGNOSIS — M25572 Pain in left ankle and joints of left foot: Principal | ICD-10-CM

## 2018-04-02 ENCOUNTER — Encounter: Payer: Self-pay | Admitting: Family Medicine

## 2018-04-02 ENCOUNTER — Ambulatory Visit (INDEPENDENT_AMBULATORY_CARE_PROVIDER_SITE_OTHER): Payer: BLUE CROSS/BLUE SHIELD | Admitting: Family Medicine

## 2018-04-02 VITALS — BP 100/72 | Ht 71.0 in | Wt 145.0 lb

## 2018-04-02 DIAGNOSIS — G8929 Other chronic pain: Secondary | ICD-10-CM

## 2018-04-02 DIAGNOSIS — M25572 Pain in left ankle and joints of left foot: Secondary | ICD-10-CM

## 2018-04-02 DIAGNOSIS — M722 Plantar fascial fibromatosis: Secondary | ICD-10-CM

## 2018-04-02 MED ORDER — METHYLPREDNISOLONE ACETATE 40 MG/ML IJ SUSP
40.0000 mg | Freq: Once | INTRAMUSCULAR | Status: AC
  Administered 2018-04-02: 40 mg via INTRA_ARTICULAR

## 2018-04-02 NOTE — Progress Notes (Signed)
Patient came in today to review MRI results - please see result note on his MRI.  Pain primarily plantar, medial arch but up medial ankle.  He's using a crutch to help get around.  We talked about options and decided to trial a plantar fascia injection today - discussed I don't expect this to take away all of his pain but it should help with the PF pain he is getting.  Wait about a week to restart his rehab program.  Will consider boot/cast with crutches, physical therapy again.  He'll keep [place] updated on how he's doing over the next few weeks.  After informed written consent timeout was performed.  Patient was seated on exam table.  Area overlying medial aspect of plantar fascia prepped with alcohol swab.  Then utilizing ultrasound guidance, patient's mid-plantar fascia was injected with 1:1 lidocaine:depomedrol.  Patient tolerated procedure well without immediate complications.

## 2018-05-29 DIAGNOSIS — M722 Plantar fascial fibromatosis: Secondary | ICD-10-CM | POA: Diagnosis not present

## 2018-06-04 DIAGNOSIS — M722 Plantar fascial fibromatosis: Secondary | ICD-10-CM | POA: Diagnosis not present

## 2018-06-06 DIAGNOSIS — M722 Plantar fascial fibromatosis: Secondary | ICD-10-CM | POA: Diagnosis not present

## 2018-06-12 DIAGNOSIS — M722 Plantar fascial fibromatosis: Secondary | ICD-10-CM | POA: Diagnosis not present

## 2018-06-24 DIAGNOSIS — M722 Plantar fascial fibromatosis: Secondary | ICD-10-CM | POA: Diagnosis not present

## 2018-06-27 DIAGNOSIS — M722 Plantar fascial fibromatosis: Secondary | ICD-10-CM | POA: Diagnosis not present

## 2018-07-02 DIAGNOSIS — M722 Plantar fascial fibromatosis: Secondary | ICD-10-CM | POA: Diagnosis not present

## 2018-07-04 ENCOUNTER — Ambulatory Visit: Payer: BLUE CROSS/BLUE SHIELD | Admitting: Family Medicine

## 2018-07-04 ENCOUNTER — Encounter: Payer: Self-pay | Admitting: Family Medicine

## 2018-07-04 VITALS — BP 146/63 | HR 62 | Ht 71.0 in | Wt 145.0 lb

## 2018-07-04 DIAGNOSIS — M25552 Pain in left hip: Secondary | ICD-10-CM

## 2018-07-04 NOTE — Patient Instructions (Signed)
Get x-rays of your hip today as you leave - we will call you with the results and next steps. This is consistent with either femoroacetabular impingement, snapping hip syndrome, or you have a labral tear. The x-rays will dictate if we need to change your therapy. Continue with the trigger point therapy and physical therapy. If the taping of your foot helps continue with this. Follow up will depend on where we go with your therapy but will likely be in 6 weeks.

## 2018-07-07 ENCOUNTER — Ambulatory Visit (HOSPITAL_BASED_OUTPATIENT_CLINIC_OR_DEPARTMENT_OTHER)
Admission: RE | Admit: 2018-07-07 | Discharge: 2018-07-07 | Disposition: A | Discharge status: Home / Self Care | Payer: BLUE CROSS/BLUE SHIELD | Source: Ambulatory Visit | Attending: Family Medicine | Admitting: Family Medicine

## 2018-07-07 ENCOUNTER — Encounter: Payer: Self-pay | Admitting: Family Medicine

## 2018-07-07 DIAGNOSIS — M25552 Pain in left hip: Secondary | ICD-10-CM | POA: Insufficient documentation

## 2018-07-07 NOTE — Progress Notes (Signed)
PCP: Patient, No Pcp Per  Subjective:   HPI: Patient is a 28 y.o. male here for left hip pain.  Patient reports the steroid injection for plantar fasciitis only helped about 10%. Going to physical therapy and seeing someone for trigger point therapy as well. He was advised to have his left hip checked out as he's reported increased issues with this. States pain up to 3/10 level, dull, limited flexion of left hip when trying to stretch. Feels weaker than his right hip. Has tried icing for this, therapy just recently started working on this too. Feels a catch and pain does not radiate from hip. No numbness, skin changes.  History reviewed. No pertinent past medical history.  Current Outpatient Medications on File Prior to Visit  Medication Sig Dispense Refill  . nitroGLYCERIN (NITRODUR - DOSED IN MG/24 HR) 0.2 mg/hr patch Apply 1/4th patch to affected arch, change daily (Patient not taking: Reported on 04/02/2018) 30 patch 1   No current facility-administered medications on file prior to visit.     History reviewed. No pertinent surgical history.  Allergies  Allergen Reactions  . Other Anaphylaxis    Pine nuts  . Peanut-Containing Drug Products Anaphylaxis    Social History   Socioeconomic History  . Marital status: Single    Spouse name: Not on file  . Number of children: Not on file  . Years of education: Not on file  . Highest education level: Not on file  Occupational History  . Not on file  Social Needs  . Financial resource strain: Not on file  . Food insecurity:    Worry: Not on file    Inability: Not on file  . Transportation needs:    Medical: Not on file    Non-medical: Not on file  Tobacco Use  . Smoking status: Never Smoker  . Smokeless tobacco: Never Used  Substance and Sexual Activity  . Alcohol use: Not on file  . Drug use: Not on file  . Sexual activity: Not on file  Lifestyle  . Physical activity:    Days per week: Not on file    Minutes per  session: Not on file  . Stress: Not on file  Relationships  . Social connections:    Talks on phone: Not on file    Gets together: Not on file    Attends religious service: Not on file    Active member of club or organization: Not on file    Attends meetings of clubs or organizations: Not on file    Relationship status: Not on file  . Intimate partner violence:    Fear of current or ex partner: Not on file    Emotionally abused: Not on file    Physically abused: Not on file    Forced sexual activity: Not on file  Other Topics Concern  . Not on file  Social History Narrative  . Not on file    History reviewed. No pertinent family history.  BP (!) 146/63   Pulse 62   Ht 5\' 11"  (1.803 m)   Wt 145 lb (65.8 kg)   BMI 20.22 kg/m   Review of Systems: See HPI above.     Objective:  Physical Exam:  Gen: NAD, comfortable in exam room  Left hip: No deformity. Full IR and ER with mild pain on IR.  Can flex to 120 degrees compared to right where can bring all way to his chest.  Strength 5/5 all motions, mild pain hip  flexion. No tenderness to palpation. NVI distally. Mild pain with logroll and fabers.  Negative piriformis and fadirs.  Right hip: No deformity. FROM with 5/5 strength. No tenderness to palpation. NVI distally.   Assessment & Plan:  1. Left hip pain - discussed differential includes snapping hip syndrome, FAI, labral tear of hip.  Advised we go ahead with radiographs as next step.  Continue PT, trigger point therapy.  F/u will depend on radiographs, how we adjust therapy based on those.  Tylenol, aleve if needed.

## 2018-07-09 DIAGNOSIS — M722 Plantar fascial fibromatosis: Secondary | ICD-10-CM | POA: Diagnosis not present

## 2018-07-09 DIAGNOSIS — M25552 Pain in left hip: Secondary | ICD-10-CM | POA: Diagnosis not present

## 2018-07-10 ENCOUNTER — Telehealth: Payer: Self-pay | Admitting: Family Medicine

## 2018-07-10 NOTE — Telephone Encounter (Signed)
Patient went to see Ellamae Sia and he suggests the patient get an MRI hip arthrogram as his next step.   Patient requesting to be sent to [facility] if approved.

## 2018-07-10 NOTE — Telephone Encounter (Signed)
We talked about this briefly.  This is the first time he and I have discussed his hip pain and he's only recently started to get this addressed by physical therapy.  It's recommended he alter his therapy and try conservative treatment for 6 weeks - this is the time frame required by insurance before they approve MRIs unless there's concern for cancer or avascular necrosis of the hip (loss of blood flow) - he doesn't have evidence of these conditions.

## 2018-07-11 NOTE — Telephone Encounter (Signed)
Spoke to patient and gave him information about the physical therapy and 6 weeks of conservative treatment.

## 2018-07-16 DIAGNOSIS — M722 Plantar fascial fibromatosis: Secondary | ICD-10-CM | POA: Diagnosis not present

## 2018-07-16 DIAGNOSIS — M25552 Pain in left hip: Secondary | ICD-10-CM | POA: Diagnosis not present

## 2018-07-21 DIAGNOSIS — M25552 Pain in left hip: Secondary | ICD-10-CM | POA: Diagnosis not present

## 2018-07-23 DIAGNOSIS — M25552 Pain in left hip: Secondary | ICD-10-CM | POA: Diagnosis not present

## 2018-07-23 DIAGNOSIS — M722 Plantar fascial fibromatosis: Secondary | ICD-10-CM | POA: Diagnosis not present

## 2018-07-30 DIAGNOSIS — M722 Plantar fascial fibromatosis: Secondary | ICD-10-CM | POA: Diagnosis not present

## 2018-07-30 DIAGNOSIS — M25552 Pain in left hip: Secondary | ICD-10-CM | POA: Diagnosis not present

## 2018-08-06 DIAGNOSIS — M25552 Pain in left hip: Secondary | ICD-10-CM | POA: Diagnosis not present

## 2018-08-06 DIAGNOSIS — M722 Plantar fascial fibromatosis: Secondary | ICD-10-CM | POA: Diagnosis not present

## 2018-08-13 DIAGNOSIS — S73192A Other sprain of left hip, initial encounter: Secondary | ICD-10-CM | POA: Diagnosis not present

## 2018-08-13 DIAGNOSIS — M5442 Lumbago with sciatica, left side: Secondary | ICD-10-CM | POA: Diagnosis not present

## 2018-08-13 DIAGNOSIS — G8929 Other chronic pain: Secondary | ICD-10-CM | POA: Diagnosis not present

## 2018-08-13 DIAGNOSIS — M25552 Pain in left hip: Secondary | ICD-10-CM | POA: Diagnosis not present

## 2018-08-14 DIAGNOSIS — M722 Plantar fascial fibromatosis: Secondary | ICD-10-CM | POA: Diagnosis not present

## 2018-08-14 DIAGNOSIS — M25552 Pain in left hip: Secondary | ICD-10-CM | POA: Diagnosis not present

## 2018-08-28 DIAGNOSIS — M24152 Other articular cartilage disorders, left hip: Secondary | ICD-10-CM | POA: Diagnosis not present

## 2018-08-28 DIAGNOSIS — S73192A Other sprain of left hip, initial encounter: Secondary | ICD-10-CM | POA: Diagnosis not present

## 2018-08-28 DIAGNOSIS — M25852 Other specified joint disorders, left hip: Secondary | ICD-10-CM | POA: Diagnosis not present

## 2018-08-28 DIAGNOSIS — M24052 Loose body in left hip: Secondary | ICD-10-CM | POA: Diagnosis not present

## 2018-08-29 DIAGNOSIS — M25552 Pain in left hip: Secondary | ICD-10-CM | POA: Diagnosis not present

## 2018-09-02 DIAGNOSIS — M25552 Pain in left hip: Secondary | ICD-10-CM | POA: Diagnosis not present

## 2018-09-04 DIAGNOSIS — M25552 Pain in left hip: Secondary | ICD-10-CM | POA: Diagnosis not present

## 2018-09-08 DIAGNOSIS — M722 Plantar fascial fibromatosis: Secondary | ICD-10-CM | POA: Diagnosis not present

## 2018-09-08 DIAGNOSIS — R531 Weakness: Secondary | ICD-10-CM | POA: Diagnosis not present

## 2018-09-08 DIAGNOSIS — R262 Difficulty in walking, not elsewhere classified: Secondary | ICD-10-CM | POA: Diagnosis not present

## 2018-09-08 DIAGNOSIS — M25552 Pain in left hip: Secondary | ICD-10-CM | POA: Diagnosis not present

## 2018-09-11 DIAGNOSIS — R262 Difficulty in walking, not elsewhere classified: Secondary | ICD-10-CM | POA: Diagnosis not present

## 2018-09-11 DIAGNOSIS — M25552 Pain in left hip: Secondary | ICD-10-CM | POA: Diagnosis not present

## 2018-09-11 DIAGNOSIS — M722 Plantar fascial fibromatosis: Secondary | ICD-10-CM | POA: Diagnosis not present

## 2018-09-11 DIAGNOSIS — R531 Weakness: Secondary | ICD-10-CM | POA: Diagnosis not present

## 2018-09-19 DIAGNOSIS — R262 Difficulty in walking, not elsewhere classified: Secondary | ICD-10-CM | POA: Diagnosis not present

## 2018-09-19 DIAGNOSIS — M25552 Pain in left hip: Secondary | ICD-10-CM | POA: Diagnosis not present

## 2018-09-19 DIAGNOSIS — M722 Plantar fascial fibromatosis: Secondary | ICD-10-CM | POA: Diagnosis not present

## 2018-09-19 DIAGNOSIS — R531 Weakness: Secondary | ICD-10-CM | POA: Diagnosis not present

## 2018-09-26 DIAGNOSIS — R262 Difficulty in walking, not elsewhere classified: Secondary | ICD-10-CM | POA: Diagnosis not present

## 2018-09-26 DIAGNOSIS — M722 Plantar fascial fibromatosis: Secondary | ICD-10-CM | POA: Diagnosis not present

## 2018-09-26 DIAGNOSIS — M25552 Pain in left hip: Secondary | ICD-10-CM | POA: Diagnosis not present

## 2018-09-26 DIAGNOSIS — R531 Weakness: Secondary | ICD-10-CM | POA: Diagnosis not present

## 2018-09-30 DIAGNOSIS — M25552 Pain in left hip: Secondary | ICD-10-CM | POA: Diagnosis not present

## 2018-10-02 DIAGNOSIS — M25552 Pain in left hip: Secondary | ICD-10-CM | POA: Diagnosis not present

## 2018-10-07 DIAGNOSIS — M25552 Pain in left hip: Secondary | ICD-10-CM | POA: Diagnosis not present

## 2018-10-09 DIAGNOSIS — M25552 Pain in left hip: Secondary | ICD-10-CM | POA: Diagnosis not present

## 2018-10-13 DIAGNOSIS — M25552 Pain in left hip: Secondary | ICD-10-CM | POA: Diagnosis not present

## 2018-10-16 DIAGNOSIS — M25552 Pain in left hip: Secondary | ICD-10-CM | POA: Diagnosis not present

## 2018-10-21 DIAGNOSIS — M25552 Pain in left hip: Secondary | ICD-10-CM | POA: Diagnosis not present

## 2018-10-23 DIAGNOSIS — M25552 Pain in left hip: Secondary | ICD-10-CM | POA: Diagnosis not present

## 2018-10-28 DIAGNOSIS — M25552 Pain in left hip: Secondary | ICD-10-CM | POA: Diagnosis not present

## 2018-10-30 DIAGNOSIS — M25552 Pain in left hip: Secondary | ICD-10-CM | POA: Diagnosis not present

## 2018-11-04 DIAGNOSIS — M25552 Pain in left hip: Secondary | ICD-10-CM | POA: Diagnosis not present

## 2018-11-06 DIAGNOSIS — M25552 Pain in left hip: Secondary | ICD-10-CM | POA: Diagnosis not present

## 2018-11-11 DIAGNOSIS — M25552 Pain in left hip: Secondary | ICD-10-CM | POA: Diagnosis not present

## 2018-11-12 DIAGNOSIS — M5416 Radiculopathy, lumbar region: Secondary | ICD-10-CM | POA: Diagnosis not present

## 2018-11-14 DIAGNOSIS — M25552 Pain in left hip: Secondary | ICD-10-CM | POA: Diagnosis not present

## 2018-11-18 DIAGNOSIS — M25552 Pain in left hip: Secondary | ICD-10-CM | POA: Diagnosis not present

## 2018-11-20 DIAGNOSIS — M25552 Pain in left hip: Secondary | ICD-10-CM | POA: Diagnosis not present

## 2018-11-25 DIAGNOSIS — M25552 Pain in left hip: Secondary | ICD-10-CM | POA: Diagnosis not present

## 2018-11-26 DIAGNOSIS — M5416 Radiculopathy, lumbar region: Secondary | ICD-10-CM | POA: Diagnosis not present

## 2018-11-26 DIAGNOSIS — M545 Low back pain: Secondary | ICD-10-CM | POA: Diagnosis not present

## 2018-11-27 DIAGNOSIS — M25552 Pain in left hip: Secondary | ICD-10-CM | POA: Diagnosis not present

## 2018-12-03 DIAGNOSIS — M25552 Pain in left hip: Secondary | ICD-10-CM | POA: Diagnosis not present

## 2018-12-09 DIAGNOSIS — M25552 Pain in left hip: Secondary | ICD-10-CM | POA: Diagnosis not present

## 2018-12-12 DIAGNOSIS — M25552 Pain in left hip: Secondary | ICD-10-CM | POA: Diagnosis not present

## 2019-01-27 DIAGNOSIS — R0789 Other chest pain: Secondary | ICD-10-CM | POA: Diagnosis not present

## 2019-03-03 DIAGNOSIS — M79672 Pain in left foot: Secondary | ICD-10-CM | POA: Diagnosis not present

## 2019-03-03 DIAGNOSIS — M722 Plantar fascial fibromatosis: Secondary | ICD-10-CM | POA: Diagnosis not present

## 2019-04-16 DIAGNOSIS — M79672 Pain in left foot: Secondary | ICD-10-CM | POA: Diagnosis not present

## 2019-05-13 DIAGNOSIS — R197 Diarrhea, unspecified: Secondary | ICD-10-CM | POA: Diagnosis not present

## 2019-05-13 DIAGNOSIS — Z20828 Contact with and (suspected) exposure to other viral communicable diseases: Secondary | ICD-10-CM | POA: Diagnosis not present

## 2019-05-13 DIAGNOSIS — A09 Infectious gastroenteritis and colitis, unspecified: Secondary | ICD-10-CM | POA: Diagnosis not present

## 2019-05-13 DIAGNOSIS — R110 Nausea: Secondary | ICD-10-CM | POA: Diagnosis not present

## 2019-07-02 DIAGNOSIS — Z20822 Contact with and (suspected) exposure to covid-19: Secondary | ICD-10-CM | POA: Diagnosis not present

## 2019-07-06 DIAGNOSIS — R5383 Other fatigue: Secondary | ICD-10-CM | POA: Diagnosis not present

## 2019-07-06 DIAGNOSIS — R05 Cough: Secondary | ICD-10-CM | POA: Diagnosis not present

## 2019-07-06 DIAGNOSIS — Z20822 Contact with and (suspected) exposure to covid-19: Secondary | ICD-10-CM | POA: Diagnosis not present

## 2019-09-04 ENCOUNTER — Ambulatory Visit: Payer: BC Managed Care – PPO | Attending: Internal Medicine

## 2019-09-04 DIAGNOSIS — Z23 Encounter for immunization: Secondary | ICD-10-CM

## 2019-09-04 NOTE — Progress Notes (Signed)
   Covid-19 Vaccination Clinic  Name:  Vincent Duran    MRN: [id number] DOB: 09-Dec-1990  09/04/2019  [name] was observed post Covid-19 immunization for 15 minutes without incident. He was provided with Vaccine Information Sheet and instruction to access the V-Safe system.   [name] was instructed to call 911 with any severe reactions post vaccine: Marland Kitchen Difficulty breathing  . Swelling of face and throat  . A fast heartbeat  . A bad rash all over body  . Dizziness and weakness   Immunizations Administered    Name Date Dose VIS Date Route   Pfizer COVID-19 Vaccine 09/04/2019  8:27 AM 0.3 mL 06/05/2019 Intramuscular   Manufacturer: [employer], [employer]   Lot: [id number]   NDC: [address]-9

## 2019-09-30 ENCOUNTER — Ambulatory Visit: Payer: BC Managed Care – PPO | Attending: Internal Medicine

## 2019-09-30 DIAGNOSIS — Z23 Encounter for immunization: Secondary | ICD-10-CM

## 2019-09-30 NOTE — Progress Notes (Signed)
   Covid-19 Vaccination Clinic  Name:  Vincent Duran    MRN: [id number] DOB: 1990-11-18  09/30/2019  [name] was observed post Covid-19 immunization for 30 minutes based on pre-vaccination screening without incident. He was provided with Vaccine Information Sheet and instruction to access the V-Safe system.   [name] was instructed to call 911 with any severe reactions post vaccine: Marland Kitchen Difficulty breathing  . Swelling of face and throat  . A fast heartbeat  . A bad rash all over body  . Dizziness and weakness   Immunizations Administered    Name Date Dose VIS Date Route   Pfizer COVID-19 Vaccine 09/30/2019  8:18 AM 0.3 mL 06/05/2019 Intramuscular   Manufacturer: [employer], [employer]   Lot: [id number]   NDC: [address]-7

## 2019-12-23 IMAGING — MR MR ANKLE*L* W/O CM
5 series · 40 of 40 positions shown · non-contrast
Comparison: None.

CLINICAL DATA: Intermittent plantar foot pain over the past 3
years. The most recent episode began 10 months ago. No known injury.
The patient is a runner.

EXAM:
MRI OF THE LEFT ANKLE WITHOUT CONTRAST
TECHNIQUE: Multiplanar, multisequence MR imaging of the ankle was performed. No
intravenous contrast was administered.

[Series 3: T2 fat-sat · axial · 3.0mm · 0.50mm/px · z∈[-92,+47]mm · 10 of 36 slices shown (1 of 2)]
[im 1/36]
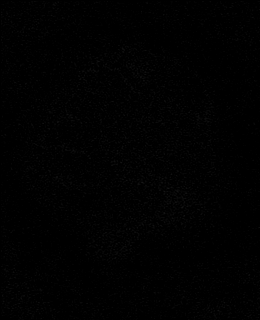
[im 4/36]
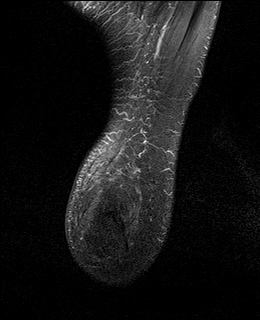
[im 8/36]
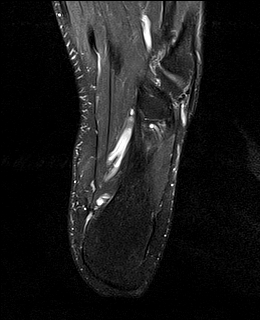
[im 12/36]
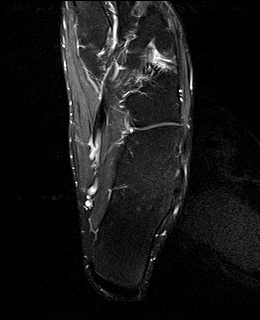
[im 16/36]
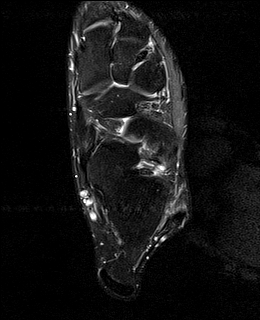
[im 20/36]
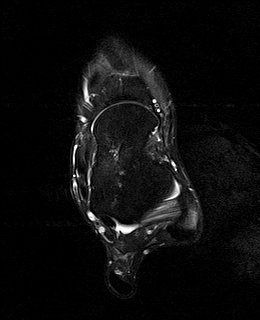
[im 24/36]
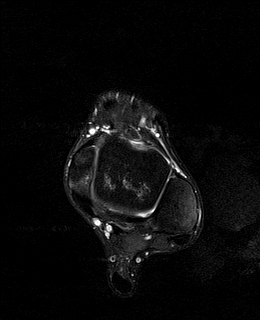
[im 28/36]
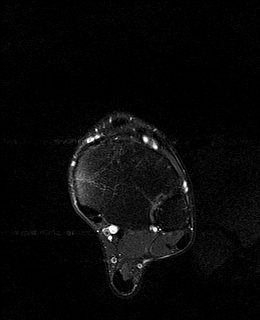
[im 32/36]
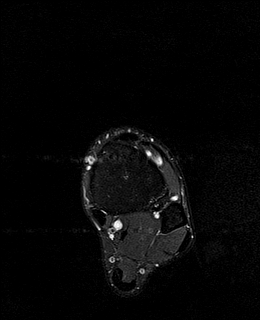
[im 36/36]
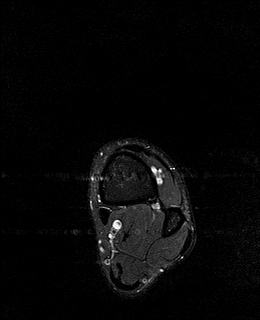

[Series 4: PD fat-sat · axial · 3.5mm · 0.47mm/px · z∈[-93,+47]mm · 9 of 33 slices shown]
[im 1/33]
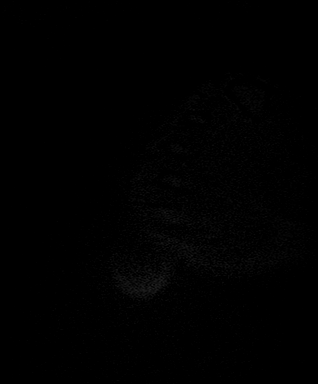
[im 5/33]
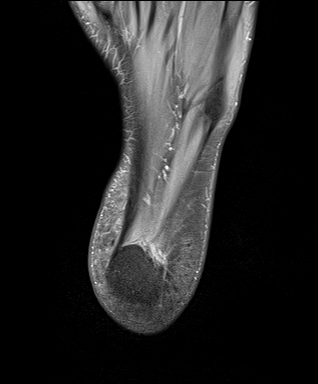
[im 9/33]
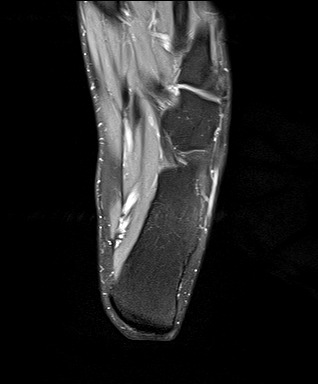
[im 13/33]
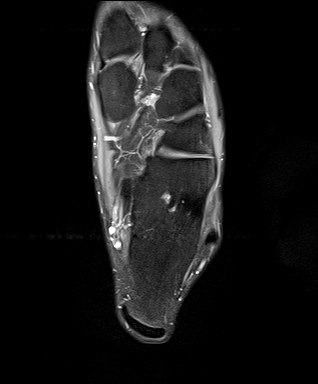
[im 17/33]
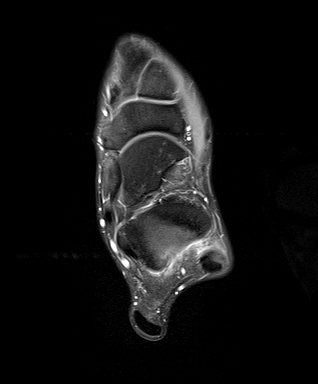
[im 21/33]
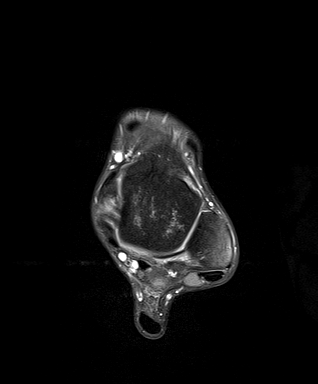
[im 25/33]
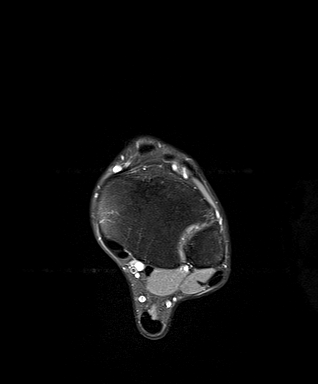
[im 29/33]
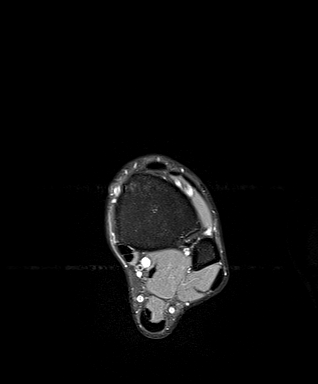
[im 33/33]
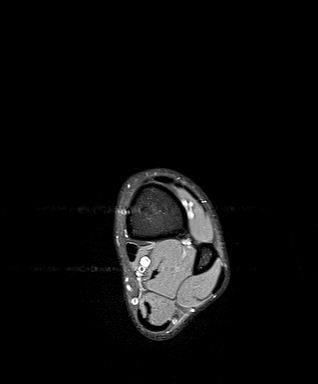

[Series 5: T2 fat-sat · coronal · 3.0mm · 0.62mm/px · 11 of 39 slices shown (2 of 2)]
[im 1/39]
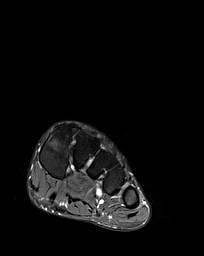
[im 4/39]
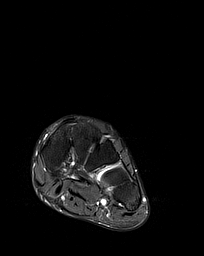
[im 8/39]
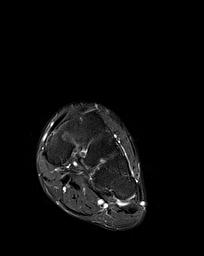
[im 12/39]
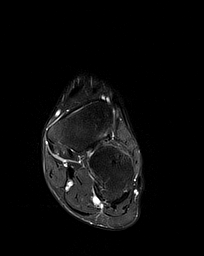
[im 16/39]
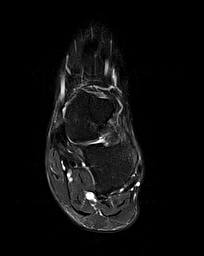
[im 20/39]
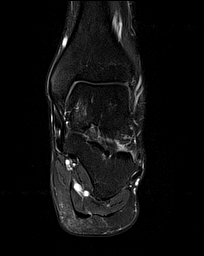
[im 23/39]
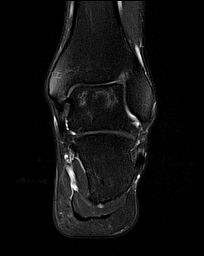
[im 27/39]
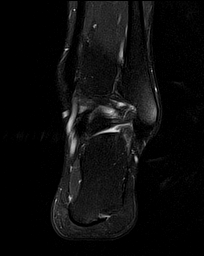
[im 31/39]
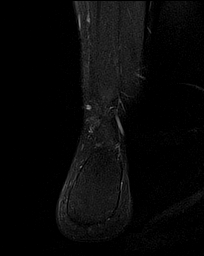
[im 35/39]
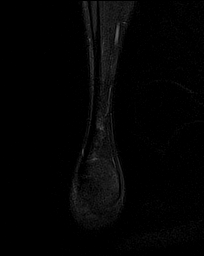
[im 39/39]
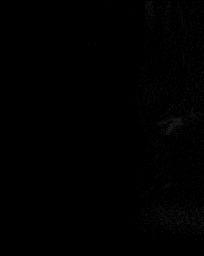

[Series 6: T1 · sagittal · 4.0mm · 0.56mm/px · 5 of 19 slices shown]
[im 1/19]
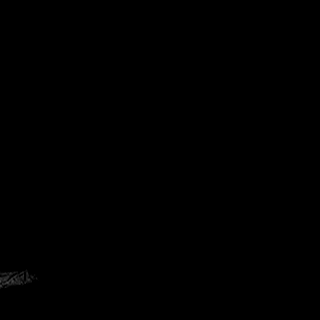
[im 5/19]
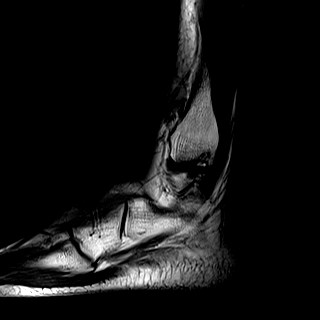
[im 10/19]
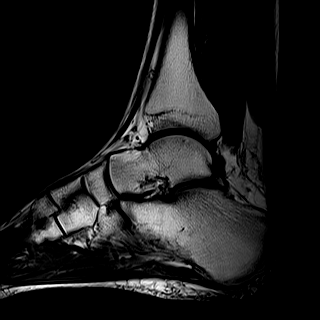
[im 14/19]
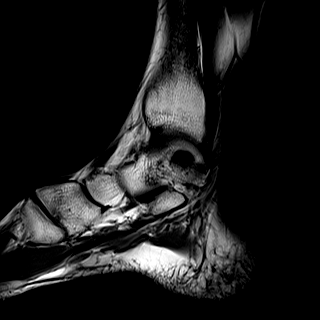
[im 19/19]
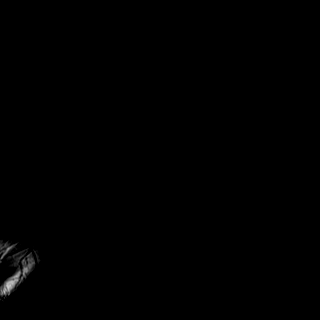

[Series 7: STIR · sagittal · 4.0mm · 0.70mm/px · 5 of 20 slices shown]
[im 1/20]
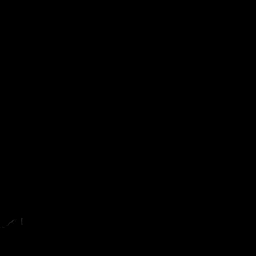
[im 5/20]
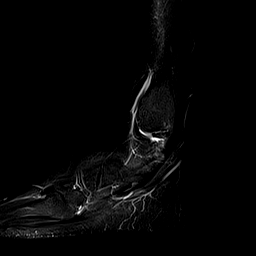
[im 10/20]
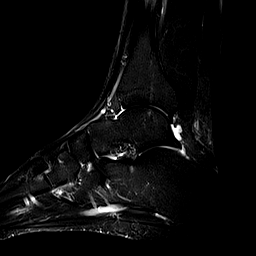
[im 15/20]
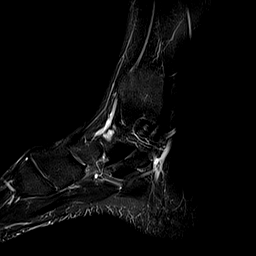
[im 20/20]
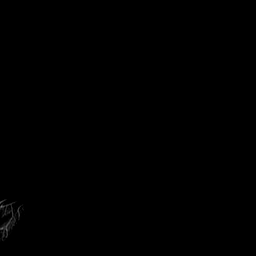

[40 of 40 positions shown; findings below may reference images not displayed]

FINDINGS: TENDONS

Peroneal: Intact.

Posteromedial: Intact.

Anterior: Intact.

Achilles: Intact.

Plantar Fascia: Intact and normal in appearance. No evidence of
plantar fasciitis.

LIGAMENTS

Lateral: Intact.

Medial: Intact.

CARTILAGE

Ankle Joint: Normal.

Subtalar Joints/Sinus Tarsi: Normal.

Bones: Mild, patchy marrow edema is seen in the talus subjacent to
the talar dome. No fracture is identified. No tarsal coalition is
seen.

Other: None.
IMPRESSION: Mild, patchy marrow edema in the talus may be due to residual active
marrow in this 27-year-old but has an appearance worrisome for
stress reaction. No fracture.

Negative for tendon or ligament tear. The plantar fascia appears
normal.

## 2020-02-02 DIAGNOSIS — Z20822 Contact with and (suspected) exposure to covid-19: Secondary | ICD-10-CM | POA: Diagnosis not present

## 2020-04-04 IMAGING — DX DG HIP (WITH OR WITHOUT PELVIS) 2-3V*L*
3 series · 3 of 3 positions shown · non-contrast
Comparison: None.

CLINICAL DATA: Left hip pain for 2 weeks without known injury.

EXAM:
DG HIP (WITH OR WITHOUT PELVIS) 2-3V LEFT

[hip ap]
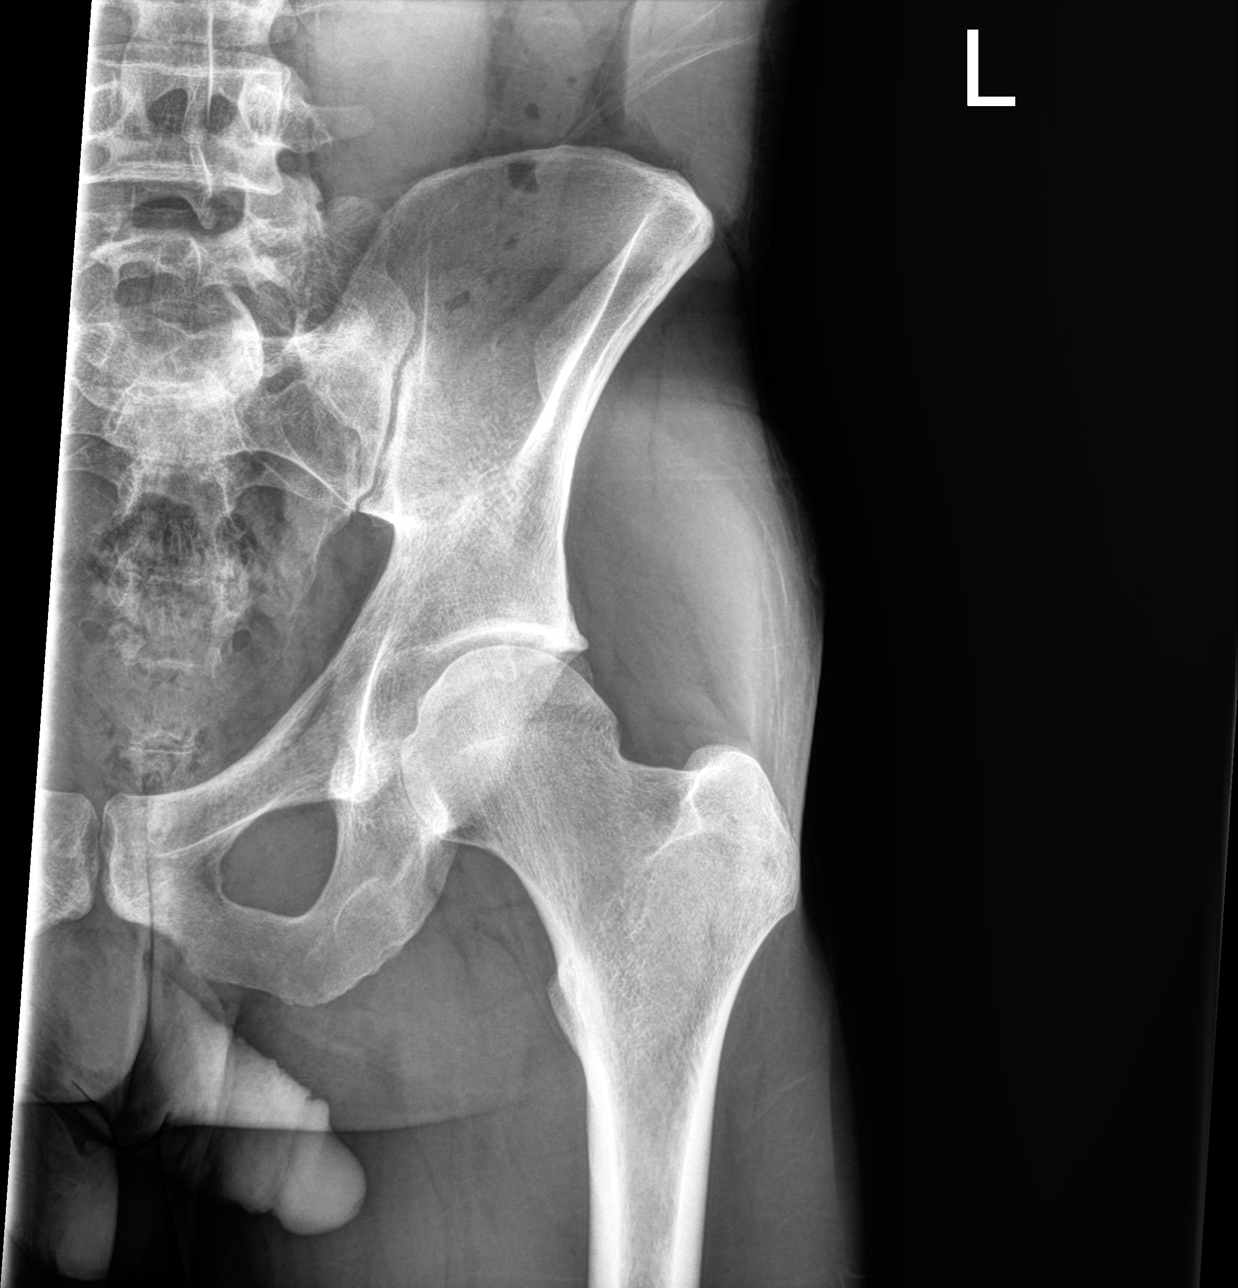

[hip lat]
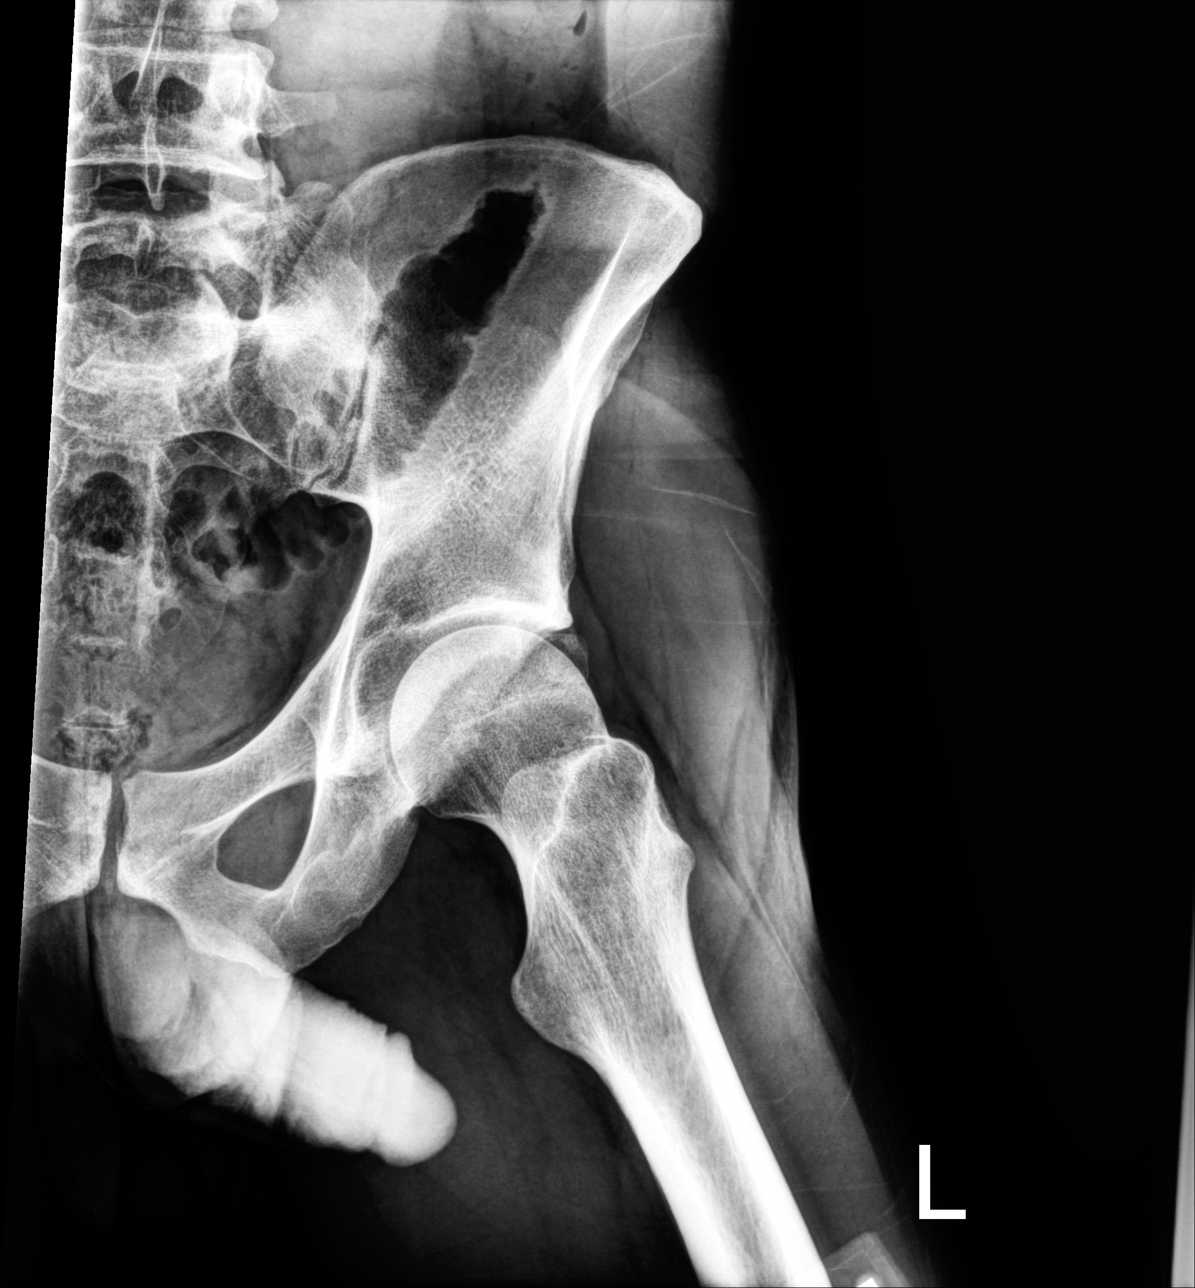

[pelvis ap]
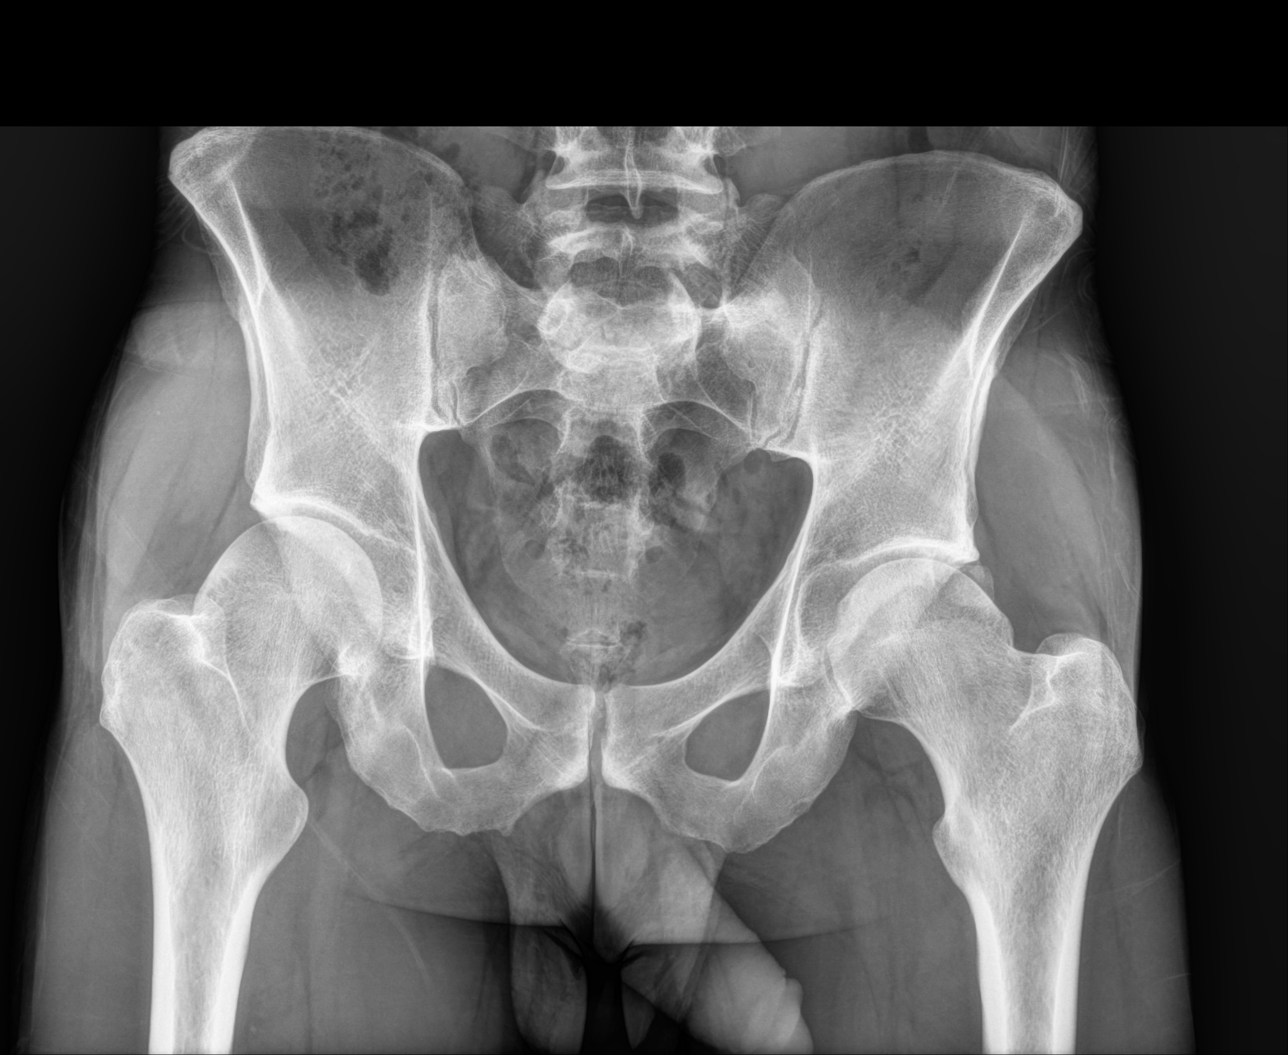

[3 of 3 positions shown; findings below may reference images not displayed]

FINDINGS: There is no evidence of hip fracture or dislocation. There is no
evidence of arthropathy or other focal bone abnormality.
IMPRESSION: Negative.

## 2020-07-07 DIAGNOSIS — Z1152 Encounter for screening for COVID-19: Secondary | ICD-10-CM | POA: Diagnosis not present

## 2020-11-23 DIAGNOSIS — R0781 Pleurodynia: Secondary | ICD-10-CM | POA: Diagnosis not present

## 2021-01-06 DIAGNOSIS — R509 Fever, unspecified: Secondary | ICD-10-CM | POA: Diagnosis not present

## 2021-01-06 DIAGNOSIS — R197 Diarrhea, unspecified: Secondary | ICD-10-CM | POA: Diagnosis not present

## 2021-08-17 DIAGNOSIS — R059 Cough, unspecified: Secondary | ICD-10-CM | POA: Diagnosis not present

## 2021-08-17 DIAGNOSIS — Z20822 Contact with and (suspected) exposure to covid-19: Secondary | ICD-10-CM | POA: Diagnosis not present

## 2021-08-17 DIAGNOSIS — R5383 Other fatigue: Secondary | ICD-10-CM | POA: Diagnosis not present

## 2021-08-23 ENCOUNTER — Ambulatory Visit: Payer: BC Managed Care – PPO | Admitting: Internal Medicine

## 2021-08-23 ENCOUNTER — Other Ambulatory Visit: Payer: Self-pay

## 2021-08-23 ENCOUNTER — Encounter: Payer: Self-pay | Admitting: Emergency Medicine

## 2021-08-23 ENCOUNTER — Encounter: Payer: Self-pay | Admitting: Internal Medicine

## 2021-08-23 ENCOUNTER — Ambulatory Visit
Admission: EM | Admit: 2021-08-23 | Discharge: 2021-08-23 | Disposition: A | Discharge status: Home / Self Care | Payer: BC Managed Care – PPO | Attending: Emergency Medicine | Admitting: Emergency Medicine

## 2021-08-23 VITALS — BP 118/82 | HR 90 | Resp 18 | Ht 71.0 in | Wt 153.2 lb

## 2021-08-23 DIAGNOSIS — Z0000 Encounter for general adult medical examination without abnormal findings: Secondary | ICD-10-CM | POA: Diagnosis not present

## 2021-08-23 DIAGNOSIS — A084 Viral intestinal infection, unspecified: Secondary | ICD-10-CM | POA: Diagnosis not present

## 2021-08-23 DIAGNOSIS — R197 Diarrhea, unspecified: Secondary | ICD-10-CM | POA: Diagnosis not present

## 2021-08-23 DIAGNOSIS — R110 Nausea: Secondary | ICD-10-CM

## 2021-08-23 DIAGNOSIS — R112 Nausea with vomiting, unspecified: Secondary | ICD-10-CM | POA: Diagnosis not present

## 2021-08-23 DIAGNOSIS — R002 Palpitations: Secondary | ICD-10-CM | POA: Diagnosis not present

## 2021-08-23 DIAGNOSIS — K529 Noninfective gastroenteritis and colitis, unspecified: Secondary | ICD-10-CM | POA: Diagnosis not present

## 2021-08-23 LAB — COMPREHENSIVE METABOLIC PANEL
ALT: 31 U/L (ref 0–53)
AST: 24 U/L (ref 0–37)
Albumin: 5 g/dL (ref 3.5–5.2)
Alkaline Phosphatase: 65 U/L (ref 39–117)
BUN: 19 mg/dL (ref 6–23)
CO2: 22 mEq/L (ref 19–32)
Calcium: 9.9 mg/dL (ref 8.4–10.5)
Chloride: 103 mEq/L (ref 96–112)
Creatinine, Ser: 1.04 mg/dL (ref 0.40–1.50)
GFR: 96.67 mL/min (ref >60.00)
Glucose, Bld: 105 mg/dL — ABNORMAL HIGH (ref 70–99)
Potassium: 3.9 mEq/L (ref 3.5–5.1)
Sodium: 137 mEq/L (ref 135–145)
Total Bilirubin: 1.4 mg/dL — ABNORMAL HIGH (ref 0.2–1.2)
Total Protein: 7.4 g/dL (ref 6.0–8.3)

## 2021-08-23 LAB — LIPID PANEL
Cholesterol: 167 mg/dL (ref 0–200)
HDL: 54 mg/dL (ref >39.00)
LDL Cholesterol: 99 mg/dL (ref 0–99)
NonHDL: 112.53
Total CHOL/HDL Ratio: 3
Triglycerides: 67 mg/dL (ref 0.0–149.0)
VLDL: 13.4 mg/dL (ref 0.0–40.0)

## 2021-08-23 LAB — CBC
HCT: 49.6 % (ref 39.0–52.0)
Hemoglobin: 16.7 g/dL (ref 13.0–17.0)
MCHC: 33.8 g/dL (ref 30.0–36.0)
MCV: 97.1 fl (ref 78.0–100.0)
Platelets: 237 10*3/uL (ref 150.0–400.0)
RBC: 5.11 Mil/uL (ref 4.22–5.81)
RDW: 12 % (ref 11.5–15.5)
WBC: 10.9 10*3/uL — ABNORMAL HIGH (ref 4.0–10.5)

## 2021-08-23 LAB — TSH: TSH: 1.34 u[IU]/mL (ref 0.35–5.50)

## 2021-08-23 LAB — POCT URINALYSIS DIP (MANUAL ENTRY)
Blood, UA: NEGATIVE
Glucose, UA: NEGATIVE mg/dL
Leukocytes, UA: NEGATIVE
Nitrite, UA: NEGATIVE
Protein Ur, POC: 30 mg/dL — AB
Spec Grav, UA: 1.015 (ref 1.010–1.025)
Urobilinogen, UA: 0.2 E.U./dL
pH, UA: 7 (ref 5.0–8.0)

## 2021-08-23 LAB — T4, FREE: Free T4: 1.1 ng/dL (ref 0.60–1.60)

## 2021-08-23 LAB — VITAMIN D 25 HYDROXY (VIT D DEFICIENCY, FRACTURES): VITD: 20.08 ng/mL — ABNORMAL LOW (ref 30.00–100.00)

## 2021-08-23 LAB — VITAMIN B12: Vitamin B-12: 255 pg/mL (ref 211–911)

## 2021-08-23 MED ORDER — ONDANSETRON HCL 4 MG PO TABS: 4.0000 mg | ORAL_TABLET | Freq: Three times a day (TID) | ORAL | 0 refills | Status: DC | PRN

## 2021-08-23 MED ORDER — SODIUM CHLORIDE 0.9 % IV BOLUS
500.0000 mL | Freq: Once | INTRAVENOUS | Status: AC
  Administered 2021-08-23: 500 mL via INTRAVENOUS

## 2021-08-23 MED ORDER — ONDANSETRON HCL 4 MG/2ML IJ SOLN: 4.0000 mg | Freq: Once | INTRAMUSCULAR | Status: DC

## 2021-08-23 MED ORDER — DIPHENHYDRAMINE HCL 50 MG/ML IJ SOLN
25.0000 mg | Freq: Once | INTRAMUSCULAR | Status: AC
  Administered 2021-08-23: 25 mg via INTRAVENOUS

## 2021-08-23 MED ORDER — ONDANSETRON HCL 4 MG/5ML PO SOLN
4.0000 mg | Freq: Once | ORAL | Status: AC
  Administered 2021-08-23: 4 mg via ORAL

## 2021-08-23 NOTE — Discharge Instructions (Addendum)
During your visit today, your urine was concerning for dehydration.  You received a bolus of 500 cc of normal saline along with 25 mg of diphenhydramine to help the Zofran that you received at your primary care office to work better. ? ?If you continue to have persistent nausea and vomiting, I recommended to go to the emergency room for further evaluation and rehydration. ?

## 2021-08-23 NOTE — Progress Notes (Signed)
? ?  Subjective:  ? ?Patient ID: Vincent Duran, male    DOB: 1990/12/11, 31 y.o.   MRN: [id number] ? ?HPI ?The patient is a 31 YO man coming in new for several concerns.  ? ?PMH, [facility], social history reviewed and updated ? ?Review of Systems  ?Constitutional: Negative.   ?HENT: Negative.    ?Eyes: Negative.   ?Respiratory:  Negative for cough, chest tightness and shortness of breath.   ?Cardiovascular:  Positive for palpitations. Negative for chest pain and leg swelling.  ?Gastrointestinal:  Positive for diarrhea, nausea and vomiting. Negative for abdominal distention, abdominal pain, anal bleeding, blood in stool and constipation.  ?Musculoskeletal: Negative.   ?Skin: Negative.   ?Neurological: Negative.   ?Psychiatric/Behavioral: Negative.    ? ?Objective:  ?Physical Exam ?Constitutional:   ?   Appearance: He is well-developed.  ?   Comments: Appears ill  ?HENT:  ?   Head: Normocephalic and atraumatic.  ?Cardiovascular:  ?   Rate and Rhythm: Normal rate and regular rhythm.  ?Pulmonary:  ?   Effort: Pulmonary effort is normal. No respiratory distress.  ?   Breath sounds: Normal breath sounds. No wheezing or rales.  ?Abdominal:  ?   General: Bowel sounds are normal. There is no distension.  ?   Palpations: Abdomen is soft.  ?   Tenderness: There is abdominal tenderness. There is no rebound.  ?Musculoskeletal:  ?   Cervical back: Normal range of motion.  ?Skin: ?   General: Skin is warm and dry.  ?   Comments: Abnormal tenting of the skin  ?Neurological:  ?   Mental Status: He is alert and oriented to person, place, and time.  ?   Coordination: Coordination normal.  ? ? ?Vitals:  ? 08/23/21 0946  ?BP: 118/82  ?Pulse: 90  ?Resp: 18  ?SpO2: 99%  ?Weight: 153 lb 3.2 oz (69.5 kg)  ?Height: 5\' 11"  (1.803 m)  ? ?EKG: Rate 83, axis normal, interval normal, sinus, no st or t wave changes, no prior to compare ? ?This visit occurred during the SARS-CoV-2 public health emergency.  Safety protocols were in place, including screening  questions prior to the visit, additional usage of staff PPE, and extensive cleaning of exam room while observing appropriate contact time as indicated for disinfecting solutions.  ? ?Assessment & Plan:  ?Zofran 4 mg IM given at visit ?

## 2021-08-23 NOTE — Patient Instructions (Signed)
We will check the labs today and the EKG looks normal.  ? ? ?

## 2021-08-23 NOTE — ED Triage Notes (Signed)
Pt reports had food poisoning last night and was seen at PCP today and advised to get IV hydration. Reports cramping in hands and feet.  ?Pt received Zofran shot at office today and had blood work labs  done  ?

## 2021-08-23 NOTE — ED Provider Notes (Signed)
[facility]    CSN: [id number] Arrival date & time: 08/23/21  1145    HISTORY   Chief Complaint  Patient presents with   Emesis   HPI Khushal Bennion is a 31 y.o. male. Patient presents to urgent care today after seeing his primary care provider, states he was advised to come to urgent care to receive a bolus of IV fluid.  He was provided with 4 mg of Zofran at the provider's office IM.  Patient states since last night, he has had persistent vomiting and diarrhea.  Urine dip today shows increased gravity with protein and ketones present.  Patient states he has been able to tolerate small sips of electrolyte replacement solution but has not eaten anything since the vomiting began last night.  The history is provided by the patient.  History reviewed. No pertinent past medical history. Patient Active Problem List   Diagnosis Date Noted   Plantar fasciitis of left foot 11/14/2017   Past Surgical History:  Procedure Laterality Date   ELBOW SURGERY Left    HIP SURGERY Left     Home Medications    Prior to Admission medications   Medication Sig Start Date End Date Taking? Authorizing Provider  ondansetron (ZOFRAN) 4 MG tablet Take 1 tablet (4 mg total) by mouth every 8 (eight) hours as needed for nausea or vomiting. 08/23/21   Myrlene Broker, MD    Family History No family history on file. Social History Social History   Tobacco Use   Smoking status: Never   Smokeless tobacco: Never   Allergies   Other and Peanut-containing drug products  Review of Systems Review of Systems Pertinent findings noted in history of present illness.   Physical Exam Triage Vital Signs ED Triage Vitals  Enc Vitals Group     BP 04/21/21 0827 (!) 147/82     Pulse Rate 04/21/21 0827 72     Resp 04/21/21 0827 18     Temp 04/21/21 0827 98.3 F (36.8 C)     Temp Source 04/21/21 0827 Oral     SpO2 04/21/21 0827 98 %     Weight --      Height --      Head Circumference --       Peak Flow --      Pain Score 04/21/21 0826 5     Pain Loc --      Pain Edu? --      Excl. in GC? --   No data found.  Updated Vital Signs BP 127/82 (BP Location: Left Arm)    Pulse 82    Temp 97.8 F (36.6 C) (Oral)    Resp 20    SpO2 99%   Physical Exam Vitals and nursing note reviewed.  Constitutional:      General: He is not in acute distress.    Appearance: Normal appearance. He is not ill-appearing.  HENT:     Head: Normocephalic and atraumatic.     Salivary Glands: Right salivary gland is not diffusely enlarged or tender. Left salivary gland is not diffusely enlarged or tender.     Right Ear: Tympanic membrane, ear canal and external ear normal. No drainage. No middle ear effusion. There is no impacted cerumen. Tympanic membrane is not erythematous or bulging.     Left Ear: Tympanic membrane, ear canal and external ear normal. No drainage.  No middle ear effusion. There is no impacted cerumen. Tympanic membrane is not erythematous or bulging.  Nose: Nose normal. No nasal deformity, septal deviation, mucosal edema, congestion or rhinorrhea.     Right Turbinates: Not enlarged, swollen or pale.     Left Turbinates: Not enlarged, swollen or pale.     Right Sinus: No maxillary sinus tenderness or frontal sinus tenderness.     Left Sinus: No maxillary sinus tenderness or frontal sinus tenderness.     Mouth/Throat:     Lips: Pink. No lesions.     Mouth: Mucous membranes are moist. No oral lesions.     Pharynx: Oropharynx is clear. Uvula midline. No posterior oropharyngeal erythema or uvula swelling.     Tonsils: No tonsillar exudate. 0 on the right. 0 on the left.  Eyes:     General: Lids are normal.        Right eye: No discharge.        Left eye: No discharge.     Extraocular Movements: Extraocular movements intact.     Conjunctiva/sclera: Conjunctivae normal.     Right eye: Right conjunctiva is not injected.     Left eye: Left conjunctiva is not injected.  Neck:      Trachea: Trachea and phonation normal.  Cardiovascular:     Rate and Rhythm: Normal rate and regular rhythm.     Pulses: Normal pulses.     Heart sounds: Normal heart sounds. No murmur heard.   No friction rub. No gallop.  Pulmonary:     Effort: Pulmonary effort is normal. No accessory muscle usage, prolonged expiration or respiratory distress.     Breath sounds: Normal breath sounds. No stridor, decreased air movement or transmitted upper airway sounds. No decreased breath sounds, wheezing, rhonchi or rales.  Chest:     Chest wall: No tenderness.  Musculoskeletal:        General: Normal range of motion.     Cervical back: Normal range of motion and neck supple. Normal range of motion.  Lymphadenopathy:     Cervical: No cervical adenopathy.  Skin:    General: Skin is warm and dry.     Findings: No erythema or rash.  Neurological:     General: No focal deficit present.     Mental Status: He is alert and oriented to person, place, and time.  Psychiatric:        Mood and Affect: Mood normal.        Behavior: Behavior normal.    Visual Acuity Right Eye Distance:   Left Eye Distance:   Bilateral Distance:    Right Eye Near:   Left Eye Near:    Bilateral Near:     UC Couse / Diagnostics / Procedures:    EKG  Radiology No results found.  Procedures Procedures (including critical care time)  UC Diagnoses / Final Clinical Impressions(s)   I have reviewed the triage vital signs and the nursing notes.  Pertinent labs & imaging results that were available during my care of the patient were reviewed by me and considered in my medical decision making (see chart for details).    Final diagnoses:  Acute gastroenteritis  Nausea vomiting and diarrhea   Patient provided with a 500 cc bolus of normal saline and 25 mg of diphenhydramine.  Patient requested that after 500 cc of normal saline which was provided at his request.  Patient reported significant improvement of symptoms  after IV bolus, patient was deemed stable for discharge home.  Patient was provided with a prescription for Zofran by his primary care provider as  well as a note to return to work.  ED Prescriptions   None    PDMP not reviewed this encounter.  Pending results:  Labs Reviewed  POCT URINALYSIS DIP (MANUAL ENTRY) - Abnormal; Notable for the following components:      Result Value   Bilirubin, UA small (*)    Ketones, POC UA large (80) (*)    Protein Ur, POC =30 (*)    All other components within normal limits    Medications Ordered in UC: Medications  sodium chloride 0.9 % bolus 500 mL (has no administration in time range)  sodium chloride 0.9 % bolus 500 mL (500 mLs Intravenous New Bag/Given 08/23/21 1229)  diphenhydrAMINE (BENADRYL) injection 25 mg (25 mg Intravenous Given 08/23/21 1228)    Disposition Upon Discharge:  Condition: stable for discharge home Home: take medications as prescribed; routine discharge instructions as discussed; follow up as advised.  Patient presented with an acute illness with associated systemic symptoms and significant discomfort requiring urgent management. In my opinion, this is a condition that a prudent lay person (someone who possesses an average knowledge of health and medicine) may potentially expect to result in complications if not addressed urgently such as respiratory distress, impairment of bodily function or dysfunction of bodily organs.   Routine symptom specific, illness specific and/or disease specific instructions were discussed with the patient and/or caregiver at length.   As such, the patient has been evaluated and assessed, work-up was performed and treatment was provided in alignment with urgent care protocols and evidence based medicine.  Patient/parent/caregiver has been advised that the patient may require follow up for further testing and treatment if the symptoms continue in spite of treatment, as clinically indicated and  appropriate.  If the patient was tested for COVID-19, Influenza and/or RSV, then the patient/parent/guardian was advised to isolate at home pending the results of his/her diagnostic coronavirus test and potentially longer if theyre positive. I have also advised pt that if his/her COVID-19 test returns positive, it's recommended to self-isolate for at least 10 days after symptoms first appeared AND until fever-free for 24 hours without fever reducer AND other symptoms have improved or resolved. Discussed self-isolation recommendations as well as instructions for household member/close contacts as per the [facility] and [place] DHHS, and also gave patient the [place] packet with this information.  Patient/parent/caregiver has been advised to return to the [facility] or PCP in 3-5 days if no better; to PCP or the Emergency Department if new signs and symptoms develop, or if the current signs or symptoms continue to change or worsen for further workup, evaluation and treatment as clinically indicated and appropriate  The patient will follow up with their current PCP if and as advised. If the patient does not currently have a PCP we will assist them in obtaining one.   The patient may need specialty follow up if the symptoms continue, in spite of conservative treatment and management, for further workup, evaluation, consultation and treatment as clinically indicated and appropriate.   Patient/parent/caregiver verbalized understanding and agreement of plan as discussed.  All questions were addressed during visit.  Please see discharge instructions below for further details of plan.  Discharge Instructions:   Discharge Instructions      During your visit today, your urine was concerning for dehydration.  You received a bolus of 500 cc of normal saline along with 25 mg of diphenhydramine to help the Zofran that you received at your primary care office to work better.  If  you continue to have persistent nausea and vomiting,  I recommended to go to the emergency room for further evaluation and rehydration.      This office note has been dictated using Museum/gallery curator.  Unfortunately, and despite my best efforts, this method of dictation can sometimes lead to occasional typographical or grammatical errors.  I apologize in advance if this occurs.     Lynden Oxford Scales, PA-C 08/23/21 1302

## 2021-08-24 ENCOUNTER — Ambulatory Visit: Payer: BC Managed Care – PPO | Admitting: Internal Medicine

## 2021-08-24 DIAGNOSIS — A084 Viral intestinal infection, unspecified: Secondary | ICD-10-CM | POA: Insufficient documentation

## 2021-08-24 DIAGNOSIS — R002 Palpitations: Secondary | ICD-10-CM | POA: Insufficient documentation

## 2021-08-24 NOTE — Assessment & Plan Note (Signed)
Started last night with vomiting and diarrhea and nausea. Still having nausea and diarrhea. Given zofran 4 mg IM during visit and given appearance advised to go to urgent care for fluids versus aggressive hydration at home. Checking CMP and CBC.  ?

## 2021-08-24 NOTE — Assessment & Plan Note (Signed)
EKG done today which is normal. He has had echo in the past many years ago due to abnormal EKG. No family history of cardiac issues, sudden death, cardiomyopathy. Checking TSH, T4, CBC, CMP, vitamin D, vitamin B12. No recent weight loss. Some change to caffeine levels. No new anxiety.  ?

## 2021-08-28 ENCOUNTER — Encounter: Payer: Self-pay | Admitting: Internal Medicine

## 2021-10-19 DIAGNOSIS — Z20822 Contact with and (suspected) exposure to covid-19: Secondary | ICD-10-CM | POA: Diagnosis not present

## 2021-10-19 DIAGNOSIS — J0140 Acute pansinusitis, unspecified: Secondary | ICD-10-CM | POA: Diagnosis not present

## 2021-10-19 DIAGNOSIS — Z03818 Encounter for observation for suspected exposure to other biological agents ruled out: Secondary | ICD-10-CM | POA: Diagnosis not present

## 2021-10-19 DIAGNOSIS — J029 Acute pharyngitis, unspecified: Secondary | ICD-10-CM | POA: Diagnosis not present

## 2022-01-19 ENCOUNTER — Ambulatory Visit: Payer: BC Managed Care – PPO | Admitting: Internal Medicine

## 2022-01-19 ENCOUNTER — Encounter: Payer: Self-pay | Admitting: Internal Medicine

## 2022-01-19 VITALS — BP 122/84 | HR 76 | Resp 18 | Ht 71.0 in | Wt 158.2 lb

## 2022-01-19 DIAGNOSIS — E538 Deficiency of other specified B group vitamins: Secondary | ICD-10-CM

## 2022-01-19 DIAGNOSIS — E559 Vitamin D deficiency, unspecified: Secondary | ICD-10-CM

## 2022-01-19 LAB — VITAMIN D 25 HYDROXY (VIT D DEFICIENCY, FRACTURES): VITD: 21.55 ng/mL — ABNORMAL LOW (ref 30.00–100.00)

## 2022-01-19 LAB — VITAMIN B12: Vitamin B-12: 1160 pg/mL — ABNORMAL HIGH (ref 211–911)

## 2022-01-19 NOTE — Assessment & Plan Note (Signed)
Doing liquid replacement and needs follow up labs on B12 today which are ordered. Palpitations are improved some with treatment.

## 2022-01-19 NOTE — Patient Instructions (Signed)
We will recheck the labs today. 

## 2022-01-19 NOTE — Assessment & Plan Note (Signed)
Checking vitamin D level today and adjust as needed. 

## 2022-01-19 NOTE — Progress Notes (Signed)
   Subjective:   Patient ID: Vincent Duran, male    DOB: 1991-05-30, 31 y.o.   MRN: [id number]  HPI The patient is a 31 YO man coming in for follow up.  Review of Systems  Constitutional: Negative.   HENT: Negative.    Eyes: Negative.   Respiratory:  Negative for cough, chest tightness and shortness of breath.   Cardiovascular:  Negative for chest pain, palpitations and leg swelling.  Gastrointestinal:  Negative for abdominal distention, abdominal pain, constipation, diarrhea, nausea and vomiting.  Musculoskeletal: Negative.   Skin: Negative.   Neurological: Negative.   Psychiatric/Behavioral: Negative.      Objective:  Physical Exam Constitutional:      Appearance: He is well-developed.  HENT:     Head: Normocephalic and atraumatic.  Cardiovascular:     Rate and Rhythm: Normal rate and regular rhythm.  Pulmonary:     Effort: Pulmonary effort is normal. No respiratory distress.     Breath sounds: Normal breath sounds. No wheezing or rales.  Abdominal:     General: Bowel sounds are normal. There is no distension.     Palpations: Abdomen is soft.     Tenderness: There is no abdominal tenderness. There is no rebound.  Musculoskeletal:     Cervical back: Normal range of motion.  Skin:    General: Skin is warm and dry.  Neurological:     Mental Status: He is alert and oriented to person, place, and time.     Coordination: Coordination normal.     Vitals:   01/19/22 1001  BP: 122/84  Pulse: 76  Resp: 18  SpO2: 98%  Weight: 158 lb 3.2 oz (71.8 kg)  Height: 5\' 11"  (1.803 m)    Assessment & Plan:

## 2022-04-11 DIAGNOSIS — Z6821 Body mass index (BMI) 21.0-21.9, adult: Secondary | ICD-10-CM | POA: Diagnosis not present

## 2022-04-11 DIAGNOSIS — H66002 Acute suppurative otitis media without spontaneous rupture of ear drum, left ear: Secondary | ICD-10-CM | POA: Diagnosis not present

## 2022-04-22 DIAGNOSIS — H6502 Acute serous otitis media, left ear: Secondary | ICD-10-CM | POA: Diagnosis not present

## 2022-04-22 DIAGNOSIS — Z6821 Body mass index (BMI) 21.0-21.9, adult: Secondary | ICD-10-CM | POA: Diagnosis not present

## 2022-09-06 DIAGNOSIS — H40013 Open angle with borderline findings, low risk, bilateral: Secondary | ICD-10-CM | POA: Diagnosis not present

## 2022-10-30 DIAGNOSIS — H66002 Acute suppurative otitis media without spontaneous rupture of ear drum, left ear: Secondary | ICD-10-CM | POA: Diagnosis not present

## 2022-10-30 DIAGNOSIS — Z6820 Body mass index (BMI) 20.0-20.9, adult: Secondary | ICD-10-CM | POA: Diagnosis not present

## 2022-10-30 DIAGNOSIS — H6500 Acute serous otitis media, unspecified ear: Secondary | ICD-10-CM | POA: Diagnosis not present

## 2023-01-01 DIAGNOSIS — Z6821 Body mass index (BMI) 21.0-21.9, adult: Secondary | ICD-10-CM | POA: Diagnosis not present

## 2023-01-01 DIAGNOSIS — J029 Acute pharyngitis, unspecified: Secondary | ICD-10-CM | POA: Diagnosis not present

## 2023-01-01 DIAGNOSIS — J00 Acute nasopharyngitis [common cold]: Secondary | ICD-10-CM | POA: Diagnosis not present

## 2023-02-07 ENCOUNTER — Ambulatory Visit: Payer: BC Managed Care – PPO | Admitting: Dermatology

## 2023-02-07 ENCOUNTER — Encounter: Payer: Self-pay | Admitting: Dermatology

## 2023-02-07 VITALS — BP 139/86

## 2023-02-07 DIAGNOSIS — D229 Melanocytic nevi, unspecified: Secondary | ICD-10-CM

## 2023-02-07 DIAGNOSIS — D1801 Hemangioma of skin and subcutaneous tissue: Secondary | ICD-10-CM

## 2023-02-07 DIAGNOSIS — W908XXA Exposure to other nonionizing radiation, initial encounter: Secondary | ICD-10-CM | POA: Diagnosis not present

## 2023-02-07 DIAGNOSIS — L719 Rosacea, unspecified: Secondary | ICD-10-CM

## 2023-02-07 DIAGNOSIS — Z1283 Encounter for screening for malignant neoplasm of skin: Secondary | ICD-10-CM

## 2023-02-07 DIAGNOSIS — L578 Other skin changes due to chronic exposure to nonionizing radiation: Secondary | ICD-10-CM

## 2023-02-07 DIAGNOSIS — L814 Other melanin hyperpigmentation: Secondary | ICD-10-CM | POA: Diagnosis not present

## 2023-02-07 DIAGNOSIS — L821 Other seborrheic keratosis: Secondary | ICD-10-CM

## 2023-02-07 MED ORDER — IVERMECTIN 1 % EX CREA
1.0000 | TOPICAL_CREAM | Freq: Every day | CUTANEOUS | 3 refills | Status: AC
Start: 2023-02-07 — End: ?

## 2023-02-07 NOTE — Progress Notes (Signed)
   New Patient Visit   Subjective  Vincent Duran is a 32 y.o. male who presents for the following: Skin Cancer Screening and Full Body Skin Exam. He has never had a skin exam. He has no personal or family history of skin cancer.  He has a mole on his back that has grown. He has had it since he was teenager.   The patient presents for Total-Body Skin Exam (TBSE) for skin cancer screening and mole check. The patient has spots, moles and lesions to be evaluated, some may be new or changing and the patient may have concern these could be cancer.    The following portions of the chart were reviewed this encounter and updated as appropriate: medications, allergies, medical history  Review of Systems:  No other skin or systemic complaints except as noted in HPI or Assessment and Plan.  Objective  Well appearing patient in no apparent distress; mood and affect are within normal limits.  A full examination was performed including scalp, head, eyes, ears, nose, lips, neck, chest, axillae, abdomen, back, buttocks, bilateral upper extremities, bilateral lower extremities, hands, feet, fingers, toes, fingernails, and toenails. All findings within normal limits unless otherwise noted below.   Relevant physical exam findings are noted in the Assessment and Plan.    Assessment & Plan   SKIN CANCER SCREENING PERFORMED TODAY.  ACTINIC DAMAGE - Chronic condition, secondary to cumulative UV/sun exposure - diffuse scaly erythematous macules with underlying dyspigmentation - Recommend daily broad spectrum sunscreen SPF 30+ to sun-exposed areas, reapply every 2 hours as needed.  - Staying in the shade or wearing long sleeves, sun glasses (UVA+UVB protection) and wide brim hats (4-inch brim around the entire circumference of the hat) are also recommended for sun protection.  - Call for new or changing lesions.  LENTIGINES, SEBORRHEIC KERATOSES, HEMANGIOMAS - Benign normal skin lesions -  Benign-appearing - Call for any changes  MELANOCYTIC NEVI - Tan-brown and/or pink-flesh-colored symmetric macules and papules - Benign appearing on exam today - Observation - Call clinic for new or changing moles - Recommend daily use of broad spectrum spf 30+ sunscreen to sun-exposed areas.   ROSACEA Exam Mid face, nose, and cheeks with erythema with telangiectasias +/- scattered inflammatory papules  Flared  Rosacea is a chronic progressive skin condition usually affecting the face of adults, causing redness and/or acne bumps. It is treatable but not curable. It sometimes affects the eyes (ocular rosacea) as well. It may respond to topical and/or systemic medication and can flare with stress, sun exposure, alcohol, exercise, topical steroids (including hydrocortisone/cortisone 10) and some foods.  Daily application of broad spectrum spf 30+ sunscreen to face is recommended to reduce flares.  Patient denies grittiness of the eyes  Treatment Plan Soolantra cream daily       Return in about 2 months (around 04/09/2023) for Rosacea Follow UP.  Jaclynn Guarneri, CMA, am acting as scribe for [employer], DO.   Documentation: I have reviewed the above documentation for accuracy and completeness, and I agree with the above.  Langston Reusing, DO

## 2023-02-07 NOTE — Patient Instructions (Addendum)
Hello Vincent Duran,  Thank you for visiting the clinic today. Your dedication to addressing your rosacea and maintaining overall skin health is greatly appreciated. Below are the key instructions and recommendations from our consultation:  - Rosacea Treatment:   - Medication: Start using Soolantra (topical ivermectin) cream. This can be applied once daily, either in the morning or at night, to control inflammation and manage the demodex mite count.   - Duration: Continue with this treatment for three months. We will then reassess its effectiveness and consider any necessary additional treatments.  - Skin Care Recommendations:   - Moisturizers: Utilize the moisturizer samples provided to help manage the dryness associated with rosacea.   - Sun Protection: Continue to practice sun protection, including the use of sunscreen as discussed.  - Skin Examination:   - Findings: The examination did not reveal any concerning signs requiring immediate intervention. The intradermal nevi observed are benign and do not require removal unless they cause discomfort or irritation.  - Follow-Up Appointment:   - Next Steps: Please schedule a follow-up visit in three months. This will allow [place] to monitor the progress of your rosacea treatment and address any concerns about hair loss.  - General Advice:   - Skin Care Practices: It's important to maintain good skin care practices, including regular moisturizing and sun protection.   - Prescription: The prescription for Soolantra may be filled with a generic equivalent (Ivermectin), which is equally effective.  Thank you once again for your visit. Please adhere to the prescribed treatment plan and feel free to contact our office if you have any questions or concerns before your next appointment.  Warm regards,  Dr. [facility]ston Reusing [facility]       Important Information  Due to recent changes in healthcare laws, you may see results of your pathology and/or  laboratory studies on MyChart before the doctors have had a chance to review them. We understand that in some cases there may be results that are confusing or concerning to you. Please understand that not all results are received at the same time and often the doctors may need to interpret multiple results in order to provide you with the best plan of care or course of treatment. Therefore, we ask that you please give [place] 2 business days to thoroughly review all your results before contacting the office for clarification. Should we see a critical lab result, you will be contacted sooner.   If You Need Anything After Your Visit  If you have any questions or concerns for your doctor, please call our main line at [phone] If no one answers, please leave a voicemail as directed and we will return your call as soon as possible. Messages left after 4 pm will be answered the following business day.   You may also send [place] a message via MyChart. We typically respond to MyChart messages within 1-2 business days.  For prescription refills, please ask your pharmacy to contact our office. Our fax number is [phone].  If you have an urgent issue when the clinic is closed that cannot wait until the next business day, you can page your doctor at the number below.    Please note that while we do our best to be available for urgent issues outside of office hours, we are not available 24/7.   If you have an urgent issue and are unable to reach [place], you may choose to seek medical care at your doctor's office, retail clinic, urgent care center, or  emergency room.  If you have a medical emergency, please immediately call 911 or go to the emergency department. In the event of inclement weather, please call our main line at [phone] for an update on the status of any delays or closures.  Dermatology Medication Tips: Please keep the boxes that topical medications come in in order to help keep track of the  instructions about where and how to use these. Pharmacies typically print the medication instructions only on the boxes and not directly on the medication tubes.   If your medication is too expensive, please contact our office at [phone] or send [place] a message through MyChart.   We are unable to tell what your co-pay for medications will be in advance as this is different depending on your insurance coverage. However, we may be able to find a substitute medication at lower cost or fill out paperwork to get insurance to cover a needed medication.   If a prior authorization is required to get your medication covered by your insurance company, please allow [place] 1-2 business days to complete this process.  Drug prices often vary depending on where the prescription is filled and some pharmacies may offer cheaper prices.  The website www.goodrx.com contains coupons for medications through different pharmacies. The prices here do not account for what the cost may be with help from insurance (it may be cheaper with your insurance), but the website can give you the price if you did not use any insurance.  - You can print the associated coupon and take it with your prescription to the pharmacy.  - You may also stop by our office during regular business hours and pick up a GoodRx coupon card.  - If you need your prescription sent electronically to a different pharmacy, notify our office through [facility] or by phone at [phone]    Skin Education :   I counseled the patient regarding the following: Sun screen (SPF 30 or greater) should be applied during peak UV exposure (between 10am and 2pm) and reapplied after exercise or swimming.  The ABCDEs of melanoma were reviewed with the patient, and the importance of monthly self-examination of moles was emphasized. Should any moles change in shape or color, or itch, bleed or burn, pt will contact our office for evaluation sooner then their  interval appointment.  Plan: Sunscreen Recommendations I recommended a broad spectrum sunscreen with a SPF of 30 or higher. I explained that SPF 30 sunscreens block approximately 97 percent of the sun's harmful rays. Sunscreens should be applied at least 15 minutes prior to expected sun exposure and then every 2 hours after that as long as sun exposure continues. If swimming or exercising sunscreen should be reapplied every 45 minutes to an hour after getting wet or sweating. One ounce, or the equivalent of a shot glass full of sunscreen, is adequate to protect the skin not covered by a bathing suit. I also recommended a lip balm with a sunscreen as well. Sun protective clothing can be used in lieu of sunscreen but must be worn the entire time you are exposed to the sun's rays.

## 2023-04-09 ENCOUNTER — Ambulatory Visit: Payer: BC Managed Care – PPO | Admitting: Dermatology

## 2023-04-09 ENCOUNTER — Encounter: Payer: Self-pay | Admitting: Dermatology

## 2023-04-09 VITALS — BP 130/84 | HR 68

## 2023-04-09 DIAGNOSIS — L649 Androgenic alopecia, unspecified: Secondary | ICD-10-CM

## 2023-04-09 DIAGNOSIS — L719 Rosacea, unspecified: Secondary | ICD-10-CM | POA: Diagnosis not present

## 2023-04-09 MED ORDER — SAFETY SEAL MISCELLANEOUS MISC: 3 refills | Status: DC

## 2023-04-09 NOTE — Progress Notes (Signed)
   Follow-Up Visit   Subjective  Vincent Duran is a 32 y.o. male who presents for the following: Rosacea. 2 month follow up. Last appointment 02/07/2023. Has been using Soolantra cream once daily for ~1 month. Was mixup at pharmacy regarding generic and brand name.  Still getting redness on face. Usually worse in winter and during dry weather.   Would like to discuss hair loss. >10 years. Has tried Nioxin in the past.   The following portions of the chart were reviewed this encounter and updated as appropriate: medications, allergies, medical history  Review of Systems:  No other skin or systemic complaints except as noted in HPI or Assessment and Plan.  Objective  Well appearing patient in no apparent distress; mood and affect are within normal limits.  Areas Examined: face  Relevant physical exam findings are noted in the Assessment and Plan.    Assessment & Plan     ROSACEA  Exam:  Mid face erythema with telangiectasias +/- scattered inflammatory papules.  Chronic and persistent condition with duration or expected duration over one year. Condition is improving with treatment but not currently at goal.   Rosacea is a chronic progressive skin condition usually affecting the face of adults, causing redness and/or acne bumps. It is treatable but not curable. It sometimes affects the eyes (ocular rosacea) as well. It may respond to topical and/or systemic medication and can flare with stress, sun exposure, alcohol, exercise, topical steroids (including hydrocortisone/cortisone 10) and some foods.  Daily application of broad spectrum spf 30+ sunscreen to face is recommended to reduce flares.  Treatment Plan Continue Soolantra cream once daily.   Recommend La Roche-Posay Toleriane UV moisturizer every morning and the gentle cleanser daily.     Long term medication management.  Patient is using long term (months to years) prescription medication  to control their dermatologic  condition.  These medications require periodic monitoring to evaluate for efficacy and side effects.   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 Condition is bothersome/symptomatic for patient. Currently flared.   Androgenetic Alopecia (or Male pattern hair loss) refers to the common patterned hair loss affecting many men.  Male pattern alopecia is mediated by dihydrotestosterone which induces miniaturization of androgen-sensitive hair follicles.  It is chronic and persistent, but treatable; not curable. Topical treatment includes: - 5% topical Minoxidil Oral treatment includes: - Finasteride 1 mg qd - Minoxidil 1.25 - 5 mg qd - Dutasteride 0.5 mg qd Adjunct therapy includes: - Low Level Laser Light Therapy (LLLT) - Platelet-rich Plasma injections (PRP) - Hair Transplantation or scalp reduction  Treatment Plan: Start Brogaine once or twice daily to scalp; minoxidil 8%, Finasteride 0.1%.   Can try Viviscal or Nutraful oral supplements.      Return for Rosacea Follow Up, androgenetic alopecia in 4-5 months.  I, Lawson Radar, CMA, am acting as scribe for [employer], DO.   Documentation: I have reviewed the above documentation for accuracy and completeness, and I agree with the above.  Vincent Reusing, DO

## 2023-04-09 NOTE — Patient Instructions (Signed)
Hello Vincent Duran,  Thank you for visiting [place] today. We appreciate your commitment to improving your health and effectively managing your condition. Here is a summary of the key instructions from today's consultation:  - Soolantra Cream: Continue applying once daily, preferably at night, to manage your rosacea.   - Application: Apply to the affected areas of the face.   - Duration: Use daily as part of your nighttime routine.  - Moisturizer with Sunscreen: Use La Roche-Posay Toleriane in the morning to protect against sun triggers.   - Application: Apply liberally to the face every morning.   - Protection: Designed to shield your skin from sun-related flare-ups.  - Facial Cleanser: Switch to a gentler cleanser provided by our office.   - Usage: To avoid drying out your skin, especially in the colder months.  - Follow-Up Appointment: Schedule a follow-up for the end of February to assess progress.   - Early Visit: If symptoms worsen around mid-December or early January, contact us to come in sooner.  - Hair Loss Treatment: We discussed using a compounded medication from [facility].   - Medication: Combining 8% minoxidil with finasteride, applied twice daily.   - Prescription: Will be sent electronically to [facility], which will contact you for details.  - Hair Supplements: Consider using Viviscal or Nutrafol to support hair growth.   - Usage: Taken as directed to complement your hair treatment regimen.  Please ensure to follow these instructions closely and do not hesitate to reach out via MyChart or phone if you have any questions or need further assistance.  Warm regards,  Dr. Lang[facility] Reusing [facility]  Your provider has sent your prescription to [facility] in [place], [place]. A pharmacy representative will call you to confirm details and take your payment information. If you do not receive a call within 24 hours, please contact the pharmacy at [phone] or  [phone]. Your unique skincare compound is being formulated in our lab (most compounds take less than 24 hours). Your prescription is shipped vis USPS to your mailbox (2-4 business days). Priority shipping is available at an additional cost. Once received, you will electronically sign/acknowledge that you received your prescription. The pharmacy hours are Monday-Friday 9 am-6 pm EST and Saturday 9 am-1 pm EST.

## 2023-04-16 ENCOUNTER — Encounter: Payer: Self-pay | Admitting: Dermatology

## 2023-05-14 ENCOUNTER — Ambulatory Visit: Payer: BC Managed Care – PPO | Admitting: Dermatology

## 2023-09-11 ENCOUNTER — Encounter: Payer: Self-pay | Admitting: Dermatology

## 2023-09-11 ENCOUNTER — Ambulatory Visit: Payer: BC Managed Care – PPO | Admitting: Dermatology

## 2023-09-11 VITALS — BP 119/76

## 2023-09-11 DIAGNOSIS — L719 Rosacea, unspecified: Secondary | ICD-10-CM | POA: Diagnosis not present

## 2023-09-11 DIAGNOSIS — L649 Androgenic alopecia, unspecified: Secondary | ICD-10-CM

## 2023-09-11 DIAGNOSIS — Z79899 Other long term (current) drug therapy: Secondary | ICD-10-CM | POA: Diagnosis not present

## 2023-09-11 MED ORDER — MINOXIDIL 2.5 MG PO TABS: ORAL_TABLET | ORAL | 2 refills | Status: DC

## 2023-09-11 NOTE — Patient Instructions (Addendum)
 Hello Mr. Orpah Clinton,  Thank you for visiting [place] today. We appreciate your commitment to improving your health and are pleased to see the progress in your treatment.  Here are the key instructions from today's consultation:  - Topical Minoxidil/Finasteride : Continue using as prescribed.  - Oral Minoxidil:   - Initial Dose: Start with half a tablet (1.25 mg) daily for the first month   - Maintenance Dose: Increase to one full tablet (2.5 mg) daily   - Side Effects: Monitor for lightheadedness or dizziness, especially when increasing the dose   - If side effects occur: Reduce back to half a tablet and inform our office  - Follow-Up: Scheduled in four months to assess progress and adjust treatment as necessary  - Educational Material: We have provided materials on the use of both topical and oral treatments for your reference  We look forward to seeing you again and continuing to support your treatment journey. If you experience any issues or have questions, please do not hesitate to contact our office.  Warm regards,  Dr. Langston Reusing, Dermatology   Important Information  Due to recent changes in healthcare laws, you may see results of your pathology and/or laboratory studies on MyChart before the doctors have had a chance to review them. We understand that in some cases there may be results that are confusing or concerning to you. Please understand that not all results are received at the same time and often the doctors may need to interpret multiple results in order to provide you with the best plan of care or course of treatment. Therefore, we ask that you please give [place] 2 business days to thoroughly review all your results before contacting the office for clarification. Should we see a critical lab result, you will be contacted sooner.   If You Need Anything After Your Visit  If you have any questions or concerns for your doctor, please call our main line at [phone] If no one  answers, please leave a voicemail as directed and we will return your call as soon as possible. Messages left after 4 pm will be answered the following business day.   You may also send [place] a message via MyChart. We typically respond to MyChart messages within 1-2 business days.  For prescription refills, please ask your pharmacy to contact our office. Our fax number is [phone].  If you have an urgent issue when the clinic is closed that cannot wait until the next business day, you can page your doctor at the number below.    Please note that while we do our best to be available for urgent issues outside of office hours, we are not available 24/7.   If you have an urgent issue and are unable to reach [place], you may choose to seek medical care at your doctor's office, retail clinic, urgent care center, or emergency room.  If you have a medical emergency, please immediately call 911 or go to the emergency department. In the event of inclement weather, please call our main line at [phone] for an update on the status of any delays or closures.  Dermatology Medication Tips: Please keep the boxes that topical medications come in in order to help keep track of the instructions about where and how to use these. Pharmacies typically print the medication instructions only on the boxes and not directly on the medication tubes.   If your medication is too expensive, please contact our office at [phone] or send [place] a  message through MyChart.   We are unable to tell what your co-pay for medications will be in advance as this is different depending on your insurance coverage. However, we may be able to find a substitute medication at lower cost or fill out paperwork to get insurance to cover a needed medication.   If a prior authorization is required to get your medication covered by your insurance company, please allow [place] 1-2 business days to complete this process.  Drug prices often vary  depending on where the prescription is filled and some pharmacies may offer cheaper prices.  The website www.goodrx.com contains coupons for medications through different pharmacies. The prices here do not account for what the cost may be with help from insurance (it may be cheaper with your insurance), but the website can give you the price if you did not use any insurance.  - You can print the associated coupon and take it with your prescription to the pharmacy.  - You may also stop by our office during regular business hours and pick up a GoodRx coupon card.  - If you need your prescription sent electronically to a different pharmacy, notify our office through [facility] or by phone at [phone]

## 2023-09-11 NOTE — Progress Notes (Signed)
   Follow-Up Visit   Subjective  Vincent Duran is a 33 y.o. male who presents for the following: Rosacea - Androgenetic Alopecia  Patient present today for follow up visit. Patient was last evaluated on 04/09/23. At this visit patient was prescribed Soolantra & Brogaine. Patient reports sxs are better. Patient denies medication changes.  The following portions of the chart were reviewed this encounter and updated as appropriate: medications, allergies, medical history  Review of Systems:  No other skin or systemic complaints except as noted in HPI or Assessment and Plan.  Objective  Well appearing patient in no apparent distress; mood and affect are within normal limits.   A focused examination was performed of the following areas: face & scalp   Relevant exam findings are noted in the Assessment and Plan.              Assessment & Plan   ROSACEA Exam Mid face erythema with telangiectasias +/- scattered inflammatory papules  Controlled   Rosacea is a chronic progressive skin condition usually affecting the face of adults, causing redness and/or acne bumps. It is treatable but not curable. It sometimes affects the eyes (ocular rosacea) as well. It may respond to topical and/or systemic medication and can flare with stress, sun exposure, alcohol, exercise, topical steroids (including hydrocortisone/cortisone 10) and some foods.  Daily application of broad spectrum spf 30+ sunscreen to face is recommended to reduce flares.  Patient denies grittiness of the eyes  Treatment Plan - Continue applying Soolantra for PRN for flares.    ANDROGENETIC ALOPECIA (MALE PATTERN HAIR LOSS) Exam: Frontal scalp thinning with intact frontal hairline and miniaturization   improvement  Androgenetic Alopecia (or Male pattern hair loss) refers to the common patterned hair loss affecting many men.  Male pattern alopecia is mediated by dihydrotestosterone which induces miniaturization of  androgen-sensitive hair follicles.  It is chronic and persistent, but treatable; not curable.  Treatment Plan: - Continue applying Medrock Brogaine minoxidil & finasteride combo to the areas on the scalp - Rx Minoxidil 2.5 MG tablet - Take a half tablet for 40mo then increase to whole tablet. Pt educated to look for lightheadedness and dizziness.   Long term medication management.  Patient is using long term (months to years) prescription medication  to control their dermatologic condition.  These medications require periodic monitoring to evaluate for efficacy and side effects and may require periodic laboratory monitoring.    ANDROGENETIC ALOPECIA   Related Medications minoxidil (LONITEN) 2.5 MG tablet Take a half tablet for 40mo then increase to whole tablet.  Return in about 4 months (around 01/11/2024) for androgenetic alopecia .    Documentation: I have reviewed the above documentation for accuracy and completeness, and I agree with the above.  I, Rafe Mackowski Marcha Solders, CMA, am acting as scribe for [employer], DO.    Langston Reusing, DO

## 2023-09-16 DIAGNOSIS — H53143 Visual discomfort, bilateral: Secondary | ICD-10-CM | POA: Diagnosis not present

## 2023-09-17 ENCOUNTER — Encounter: Payer: Self-pay | Admitting: Internal Medicine

## 2023-09-17 ENCOUNTER — Ambulatory Visit (INDEPENDENT_AMBULATORY_CARE_PROVIDER_SITE_OTHER): Admitting: Internal Medicine

## 2023-09-17 VITALS — BP 122/88 | HR 56 | Temp 97.4°F | Ht 71.0 in | Wt 144.0 lb

## 2023-09-17 DIAGNOSIS — E538 Deficiency of other specified B group vitamins: Secondary | ICD-10-CM

## 2023-09-17 DIAGNOSIS — Z0000 Encounter for general adult medical examination without abnormal findings: Secondary | ICD-10-CM | POA: Insufficient documentation

## 2023-09-17 DIAGNOSIS — Z13220 Encounter for screening for lipoid disorders: Secondary | ICD-10-CM

## 2023-09-17 DIAGNOSIS — E559 Vitamin D deficiency, unspecified: Secondary | ICD-10-CM

## 2023-09-17 LAB — CBC
HCT: 46.1 % (ref 39.0–52.0)
Hemoglobin: 15.7 g/dL (ref 13.0–17.0)
MCHC: 34.1 g/dL (ref 30.0–36.0)
MCV: 97.8 fl (ref 78.0–100.0)
Platelets: 236 10*3/uL (ref 150.0–400.0)
RBC: 4.72 Mil/uL (ref 4.22–5.81)
RDW: 12 % (ref 11.5–15.5)
WBC: 6.2 10*3/uL (ref 4.0–10.5)

## 2023-09-17 LAB — LIPID PANEL
Cholesterol: 174 mg/dL (ref 0–200)
HDL: 55.5 mg/dL (ref >39.00)
LDL Cholesterol: 107 mg/dL — ABNORMAL HIGH (ref 0–99)
NonHDL: 118.98
Total CHOL/HDL Ratio: 3
Triglycerides: 58 mg/dL (ref 0.0–149.0)
VLDL: 11.6 mg/dL (ref 0.0–40.0)

## 2023-09-17 LAB — COMPREHENSIVE METABOLIC PANEL
ALT: 37 U/L (ref 0–53)
AST: 25 U/L (ref 0–37)
Albumin: 5 g/dL (ref 3.5–5.2)
Alkaline Phosphatase: 62 U/L (ref 39–117)
BUN: 14 mg/dL (ref 6–23)
CO2: 29 meq/L (ref 19–32)
Calcium: 10.1 mg/dL (ref 8.4–10.5)
Chloride: 102 meq/L (ref 96–112)
Creatinine, Ser: 0.84 mg/dL (ref 0.40–1.50)
GFR: 115.71 mL/min (ref >60.00)
Glucose, Bld: 94 mg/dL (ref 70–99)
Potassium: 4.8 meq/L (ref 3.5–5.1)
Sodium: 139 meq/L (ref 135–145)
Total Bilirubin: 0.9 mg/dL (ref 0.2–1.2)
Total Protein: 7.4 g/dL (ref 6.0–8.3)

## 2023-09-17 LAB — VITAMIN D 25 HYDROXY (VIT D DEFICIENCY, FRACTURES): VITD: 21.54 ng/mL — ABNORMAL LOW (ref 30.00–100.00)

## 2023-09-17 LAB — VITAMIN B12: Vitamin B-12: 269 pg/mL (ref 211–911)

## 2023-09-17 NOTE — Assessment & Plan Note (Signed)
 Checking vitamin D for stability.

## 2023-09-17 NOTE — Assessment & Plan Note (Signed)
Flu shot declines. Tetanus up to date. Counseled about sun safety and mole surveillance. Counseled about the dangers of distracted driving. Given 10 year screening recommendations.

## 2023-09-17 NOTE — Assessment & Plan Note (Signed)
 Checking B12 for stability.

## 2023-09-17 NOTE — Progress Notes (Signed)
   Subjective:   Patient ID: Vincent Duran, male    DOB: 27-Mar-1991, 33 y.o.   MRN: [id number]  HPI The patient is here for physical.  PMH, [facility], social history reviewed and updated  Review of Systems  Constitutional: Negative.   HENT: Negative.    Eyes: Negative.   Respiratory:  Negative for cough, chest tightness and shortness of breath.   Cardiovascular:  Negative for chest pain, palpitations and leg swelling.  Gastrointestinal:  Negative for abdominal distention, abdominal pain, constipation, diarrhea, nausea and vomiting.  Musculoskeletal: Negative.   Skin: Negative.   Neurological: Negative.   Psychiatric/Behavioral: Negative.      Objective:  Physical Exam Constitutional:      Appearance: He is well-developed.  HENT:     Head: Normocephalic and atraumatic.  Cardiovascular:     Rate and Rhythm: Normal rate and regular rhythm.  Pulmonary:     Effort: Pulmonary effort is normal. No respiratory distress.     Breath sounds: Normal breath sounds. No wheezing or rales.  Abdominal:     General: Bowel sounds are normal. There is no distension.     Palpations: Abdomen is soft.     Tenderness: There is no abdominal tenderness. There is no rebound.  Musculoskeletal:     Cervical back: Normal range of motion.  Skin:    General: Skin is warm and dry.  Neurological:     Mental Status: He is alert and oriented to person, place, and time.     Coordination: Coordination normal.     Vitals:   09/17/23 0811  BP: 122/88  Pulse: (!) 56  Temp: (!) 97.4 F (36.3 C)  TempSrc: Oral  SpO2: 98%  Weight: 144 lb (65.3 kg)  Height: 5\' 11"  (1.803 m)    Assessment & Plan:

## 2023-09-18 ENCOUNTER — Encounter: Payer: Self-pay | Admitting: Internal Medicine

## 2023-09-26 ENCOUNTER — Ambulatory Visit (HOSPITAL_COMMUNITY)
Admission: RE | Admit: 2023-09-26 | Discharge: 2023-09-26 | Disposition: A | Discharge status: Home / Self Care | Payer: Self-pay | Source: Ambulatory Visit | Attending: Internal Medicine | Admitting: Internal Medicine

## 2023-09-26 DIAGNOSIS — Z0000 Encounter for general adult medical examination without abnormal findings: Secondary | ICD-10-CM | POA: Insufficient documentation

## 2023-09-30 ENCOUNTER — Encounter: Payer: Self-pay | Admitting: Internal Medicine

## 2023-11-11 DIAGNOSIS — H10023 Other mucopurulent conjunctivitis, bilateral: Secondary | ICD-10-CM | POA: Diagnosis not present

## 2023-11-11 DIAGNOSIS — Z6820 Body mass index (BMI) 20.0-20.9, adult: Secondary | ICD-10-CM | POA: Diagnosis not present

## 2023-11-11 DIAGNOSIS — J00 Acute nasopharyngitis [common cold]: Secondary | ICD-10-CM | POA: Diagnosis not present

## 2023-11-22 DIAGNOSIS — R21 Rash and other nonspecific skin eruption: Secondary | ICD-10-CM | POA: Diagnosis not present

## 2023-12-02 ENCOUNTER — Other Ambulatory Visit: Payer: Self-pay | Admitting: Dermatology

## 2023-12-02 DIAGNOSIS — L649 Androgenic alopecia, unspecified: Secondary | ICD-10-CM

## 2024-01-07 ENCOUNTER — Other Ambulatory Visit: Payer: Self-pay

## 2024-01-07 ENCOUNTER — Encounter: Payer: Self-pay | Admitting: Dermatology

## 2024-01-07 MED ORDER — SAFETY SEAL MISCELLANEOUS MISC: 3 refills | Status: AC

## 2024-03-04 ENCOUNTER — Telehealth: Payer: Self-pay | Admitting: Dermatology

## 2024-03-04 NOTE — Telephone Encounter (Signed)
 Pt has an upcoming appt on 03/17/24. He states right now everything is going well and his prescriptions seem to be working. He would like to know if he needs to keep his appt on 9/23 to continue filling his prescriptions? Please advise at: [phone].

## 2024-03-17 ENCOUNTER — Ambulatory Visit: Admitting: Dermatology

## 2024-03-20 ENCOUNTER — Other Ambulatory Visit: Payer: Self-pay | Admitting: Dermatology

## 2024-03-20 DIAGNOSIS — L649 Androgenic alopecia, unspecified: Secondary | ICD-10-CM

## 2024-08-10 ENCOUNTER — Ambulatory Visit: Admitting: Dermatology
